# Patient Record
Sex: Female | Born: 1990 | Hispanic: Yes | Marital: Single | Smoking: Never smoker
Health system: Southern US, Community
[De-identification: ages and names within clinical notes are randomized; demographics above are authoritative.]

## PROBLEM LIST (undated history)

## (undated) ENCOUNTER — Inpatient Hospital Stay (HOSPITAL_COMMUNITY): Payer: Self-pay

## (undated) DIAGNOSIS — K2970 Gastritis, unspecified, without bleeding: Secondary | ICD-10-CM

## (undated) HISTORY — PX: NO PAST SURGERIES: SHX2092

## (undated) NOTE — *Deleted (*Deleted)
  History     CSN: [id number]  Arrival date and time: 07/06/20 4098   First Provider Initiated Contact with Patient 07/06/20 0920      Chief Complaint  Patient presents with  . Abdominal Pain   Ms. Kelly Barnes is a 70 y.o. year old G5P2012 female at [redacted]w[redacted]d weeks gestation who presents to MAU reporting ***.   OB History    Gravida  4   Para  2   Term  2   Preterm      AB  1   Living  2     SAB  1   TAB      Ectopic      Multiple      Live Births  2           Past Medical History:  Diagnosis Date  . Gastritis     Past Surgical History:  Procedure Laterality Date  . NO PAST SURGERIES      Family History  Problem Relation Age of Onset  . Lupus Mother   . Diabetes Father     Social History   Tobacco Use  . Smoking status: Never Smoker  . Smokeless tobacco: Never Used  Vaping Use  . Vaping Use: Never used  Substance Use Topics  . Alcohol use: No  . Drug use: Yes    Types: Marijuana    Comment: September 2019    Allergies: No Known Allergies  Medications Prior to Admission  Medication Sig Dispense Refill Last Dose  . metroNIDAZOLE (FLAGYL) 500 MG tablet Take 1 tablet (500 mg total) by mouth 2 (two) times daily. 14 tablet 0   . ondansetron (ZOFRAN ODT) 4 MG disintegrating tablet Take 1 tablet (4 mg total) by mouth every 8 (eight) hours as needed for nausea or vomiting. 12 tablet 0   . pantoprazole (PROTONIX) 40 MG tablet Take 1 tablet (40 mg total) by mouth daily. 30 tablet 0     Review of Systems Physical Exam   Blood pressure (!) 152/81, pulse 94, temperature 98.4 F (36.9 C), resp. rate 16, height 5\' 3"  (1.6 m), weight 101.2 kg, last menstrual period 05/30/2020, SpO2 100 %, unknown if currently breastfeeding.  Physical Exam  MAU Course  Procedures  MDM ***  Assessment and Plan  ***  Raelyn Mora, MSN, CNM 07/06/2020, 11:25 AM

---

## 2011-08-11 ENCOUNTER — Emergency Department (HOSPITAL_COMMUNITY)
Admission: EM | Admit: 2011-08-11 | Discharge: 2011-08-11 | Disposition: A | Discharge status: Home / Self Care | Payer: Self-pay | Source: Home / Self Care | Attending: Family Medicine | Admitting: Family Medicine

## 2011-08-11 DIAGNOSIS — R110 Nausea: Secondary | ICD-10-CM

## 2011-08-11 DIAGNOSIS — Z3400 Encounter for supervision of normal first pregnancy, unspecified trimester: Secondary | ICD-10-CM

## 2011-08-11 HISTORY — DX: Gastritis, unspecified, without bleeding: K29.70

## 2011-08-11 MED ORDER — PRENATAL COMPLETE 14-0.4 MG PO TABS: 1.0000 | ORAL_TABLET | ORAL | Status: DC

## 2011-08-11 NOTE — ED Notes (Signed)
Pt states she hasn't had period since Oct and would like to have pregnancy test

## 2011-08-11 NOTE — ED Provider Notes (Signed)
History     CSN: [id number] Arrival date & time: 08/11/2011  1:57 PM   First MD Initiated Contact with Patient 08/11/11 1251      Chief Complaint  Patient presents with  . Possible Pregnancy    (Consider location/radiation/quality/duration/timing/severity/associated sxs/prior treatment) HPI Comments: Just wanted to be checked  For pregnanacy, I dont trust the pharmacy test".my last period was 10/16 "around", do feel some nausea in the mornings, No Pain, No vaginal bleeeding  The history is provided by the patient.    Past Medical History  Diagnosis Date  . Gastritis     History reviewed. No pertinent past surgical history.  History reviewed. No pertinent family history.  History  Substance Use Topics  . Smoking status: Never Smoker   . Smokeless tobacco: Not on file  . Alcohol Use: No    OB History    Grav Para Term Preterm Abortions TAB SAB Ect Mult Living                  Review of Systems  Constitutional: Positive for appetite change. Negative for fever.  Gastrointestinal: Positive for nausea. Negative for abdominal pain.  Genitourinary: Negative for dysuria and vaginal bleeding.    Allergies  Review of patient's allergies indicates no known allergies.  Home Medications   Current Outpatient Rx  Name Route Sig Dispense Refill  . PRENATAL COMPLETE 14-0.4 MG PO TABS Oral Take 1 tablet by mouth 1 day or 1 dose. 60 each 1    BP 138/59  Pulse 93  Temp(Src) 98.6 F (37 C) (Oral)  Resp 18  SpO2 100%  LMP 06/13/2011  Physical Exam  Constitutional: She appears well-developed and well-nourished. No distress.  Abdominal: Soft. Normal appearance. She exhibits no distension.  Skin: Skin is warm.  Psychiatric: She has a normal mood and affect. Her behavior is normal.    ED Course  Procedures (including critical care time)   Labs Reviewed  POCT PREGNANCY, URINE  POCT PREGNANCY, URINE   No results found.   1. Pregnancy, first       MDM    Pregnancy asymptomatic 1st trimester- referred to [facility] for prenatal  care and started on prenatal vitamins        Jimmie Molly, MD 08/11/11 [phone]

## 2011-08-24 ENCOUNTER — Encounter (HOSPITAL_COMMUNITY): Payer: Self-pay | Admitting: *Deleted

## 2011-08-24 ENCOUNTER — Emergency Department (INDEPENDENT_AMBULATORY_CARE_PROVIDER_SITE_OTHER)
Admission: EM | Admit: 2011-08-24 | Discharge: 2011-08-24 | Disposition: A | Discharge status: Nursing Home (Includes ICF) | Payer: Self-pay | Source: Home / Self Care | Attending: Emergency Medicine | Admitting: Emergency Medicine

## 2011-08-24 ENCOUNTER — Encounter (HOSPITAL_COMMUNITY): Payer: Self-pay

## 2011-08-24 ENCOUNTER — Inpatient Hospital Stay (HOSPITAL_COMMUNITY): Payer: Medicaid Other

## 2011-08-24 ENCOUNTER — Inpatient Hospital Stay (HOSPITAL_COMMUNITY)
Admission: AD | Admit: 2011-08-24 | Discharge: 2011-08-24 | Disposition: A | Discharge status: Home / Self Care | Payer: Medicaid Other | Source: Ambulatory Visit | Attending: Obstetrics & Gynecology | Admitting: Obstetrics & Gynecology

## 2011-08-24 DIAGNOSIS — O200 Threatened abortion: Secondary | ICD-10-CM

## 2011-08-24 DIAGNOSIS — O209 Hemorrhage in early pregnancy, unspecified: Secondary | ICD-10-CM | POA: Insufficient documentation

## 2011-08-24 LAB — ABO/RH: ABO/RH(D): B POS

## 2011-08-24 LAB — WET PREP, GENITAL
Trich, Wet Prep: NONE SEEN
Yeast Wet Prep HPF POC: NONE SEEN

## 2011-08-24 LAB — CBC
Platelets: 388 10*3/uL (ref 150–400)
RBC: 4.14 MIL/uL (ref 3.87–5.11)
WBC: 14.1 10*3/uL — ABNORMAL HIGH (ref 4.0–10.5)

## 2011-08-24 NOTE — ED Provider Notes (Signed)
History     Chief Complaint  Patient presents with  . Vaginal Bleeding   HPI  Pt is 10weeks 4 days pregnant and was seen at Urgent Care today with 1 day of painless vaginal bleeding that started at 2 pm today after intercourse last night.  She denies cramping, constipation, diarrhea, or UTI symptoms.  She denies vaginal discharge.  She denies fever or chills.    Past Medical History  Diagnosis Date  . Gastritis     No past surgical history on file.  No family history on file.  History  Substance Use Topics  . Smoking status: Never Smoker   . Smokeless tobacco: Not on file  . Alcohol Use: No    Allergies: No Known Allergies  Prescriptions prior to admission  Medication Sig Dispense Refill  . Prenatal Vit-Fe Fumarate-FA (PRENATAL COMPLETE) 14-0.4 MG TABS Take 1 tablet by mouth 1 day or 1 dose.  60 each  1    ROS Physical Exam   Blood pressure 130/70, pulse 104, temperature 99.2 F (37.3 C), temperature source Oral, resp. rate 20, height 5\' 2"  (1.575 m), weight 184 lb 6.4 oz (83.643 kg), last menstrual period 06/11/2011, SpO2 99.00%.  Physical Exam  Vitals reviewed. Constitutional: She is oriented to person, place, and time. She appears well-developed and well-nourished.  HENT:  Head: Normocephalic.  Eyes: Pupils are equal, round, and reactive to light.  Neck: Normal range of motion. Neck supple.  Cardiovascular: Normal rate.   Respiratory: Effort normal.  GI: Soft. She exhibits no distension. There is no tenderness. There is no rebound and no guarding.       FHR not audible with doppler  Genitourinary:       Small amount of dark blood from os; uterus soft enlarged ~10 weeks size, nontender; adnexal without palpable enlargement or tenderness  Musculoskeletal: Normal range of motion.  Neurological: She is alert and oriented to person, place, and time.  Skin: Skin is warm and dry.  Psychiatric: She has a normal mood and affect.    MAU Course   Procedures CBC HCG ABO/RH Ultrasound Wet prep  GC/chlamydia-pending Results for orders placed during the hospital encounter of 08/24/11 (from the past 24 hour(s))  CBC     Status: Abnormal   Collection Time   08/24/11  6:32 PM      Component Value Range   WBC 14.1 (*) 4.0 - 10.5 (K/uL)   RBC 4.14  3.87 - 5.11 (MIL/uL)   Hemoglobin 12.7  12.0 - 15.0 (g/dL)   HCT 16.1 (*) 09.6 - 46.0 (%)   MCV 86.7  78.0 - 100.0 (fL)   MCH 30.7  26.0 - 34.0 (pg)   MCHC 35.4  30.0 - 36.0 (g/dL)   RDW 04.5  40.9 - 81.1 (%)   Platelets 388  150 - 400 (K/uL)  ABO/RH     Status: Normal   Collection Time   08/24/11  6:32 PM      Component Value Range   ABO/RH(D) B POS    HCG, QUANTITATIVE, PREGNANCY     Status: Abnormal   Collection Time   08/24/11  6:32 PM      Component Value Range   hCG, Beta Chain, Quant, S [id number] (*) <5 (mIU/mL)   Assessment and Plan  Bleeding in early pregnancy Obtain prenatal care  LINEBERRY,SUSAN 08/24/2011, 6:17 PM

## 2011-08-24 NOTE — ED Provider Notes (Signed)
Kelly Barnes is a 20 y.o. who has been pregnant with LMP of October who presents with one-day of painless vaginal bleeding.  She denies any pain or cramps fevers or chills or vaginal discharge. The bleeding started spontaneously without any pelvic trauma.  She was dropped off in urgent care by a family member.  The bleeding stopped before she arrived in urgent care,  PMH reviewed.  ROS as above otherwise neg Medications reviewed. No current facility-administered medications for this encounter.   Current Outpatient Prescriptions  Medication Sig Dispense Refill  . Prenatal Vit-Fe Fumarate-FA (PRENATAL COMPLETE) 14-0.4 MG TABS Take 1 tablet by mouth 1 day or 1 dose.  60 each  1    Exam:  BP 141/64  Pulse 94  Temp 99.1 F (37.3 C)  Resp 18  SpO2 100%  LMP 06/13/2011 Gen: Well NAD HEENT: EOMI,  MMM Lungs: CTABL Nl WOB Heart: RRR no MRG Abd: NABS, NT, ND Exts: Non edematous BL  LE, warm and well perfused.   Assessment and plan: 20 year old G1 P0 with painless vaginal bleeding in the first trimester.  I suspect this is a threatened abortion. Plan to discharge patient and instruct her to proceed to [facility] hospital for further care and evaluation. She is medically stable at this time. Plan to send over via shuttle.   Clementeen Graham 08/24/11 1727

## 2011-08-24 NOTE — Progress Notes (Signed)
Patient states she was seen at Urgent Care today for spotting since this am. Was sent to MAU for further evaluation. Patient denies any pain. Last intercourse last night.

## 2011-08-24 NOTE — ED Provider Notes (Signed)
Medical screening examination/treatment/procedure(s) were performed by a resident physician and as supervising physician I was immediately available for consultation/collaboration.  Hillery Hunter, MD 08/24/11 [phone]

## 2011-08-24 NOTE — ED Notes (Signed)
Pt  repotts  Her  Last   Period    Was  Oct  15  She  Reports  She  Noticed  Vag  Bleeding  Today - She  Has had  No  Prenatal   Care     Yet   -  She  Is  Awake  As  Well as  Alert    She  denys  Any pain      At this  Time     She  Is  Sitting  Upright on  Exam table    In no  Distress

## 2011-12-20 ENCOUNTER — Inpatient Hospital Stay (HOSPITAL_COMMUNITY)
Admission: AD | Admit: 2011-12-20 | Discharge: 2011-12-20 | Disposition: A | Discharge status: Home / Self Care | Payer: Medicaid Other | Source: Ambulatory Visit | Attending: Obstetrics & Gynecology | Admitting: Obstetrics & Gynecology

## 2011-12-20 ENCOUNTER — Encounter (HOSPITAL_COMMUNITY): Payer: Self-pay

## 2011-12-20 DIAGNOSIS — O2340 Unspecified infection of urinary tract in pregnancy, unspecified trimester: Secondary | ICD-10-CM

## 2011-12-20 DIAGNOSIS — R109 Unspecified abdominal pain: Secondary | ICD-10-CM | POA: Insufficient documentation

## 2011-12-20 DIAGNOSIS — M545 Low back pain, unspecified: Secondary | ICD-10-CM | POA: Insufficient documentation

## 2011-12-20 DIAGNOSIS — O99891 Other specified diseases and conditions complicating pregnancy: Secondary | ICD-10-CM | POA: Insufficient documentation

## 2011-12-20 DIAGNOSIS — B379 Candidiasis, unspecified: Secondary | ICD-10-CM

## 2011-12-20 LAB — URINE MICROSCOPIC-ADD ON

## 2011-12-20 LAB — URINALYSIS, ROUTINE W REFLEX MICROSCOPIC
Glucose, UA: NEGATIVE mg/dL
Hgb urine dipstick: NEGATIVE
Ketones, ur: NEGATIVE mg/dL
Protein, ur: NEGATIVE mg/dL

## 2011-12-20 LAB — WET PREP, GENITAL

## 2011-12-20 MED ORDER — TERCONAZOLE 0.8 % VA CREA: 1.0000 | TOPICAL_CREAM | Freq: Every day | VAGINAL | Status: AC

## 2011-12-20 MED ORDER — FLUCONAZOLE 150 MG PO TABS
150.0000 mg | ORAL_TABLET | Freq: Once | ORAL | Status: AC
  Administered 2011-12-20: 150 mg via ORAL
  Filled 2011-12-20: qty 1

## 2011-12-20 MED ORDER — CEPHALEXIN 500 MG PO CAPS: 500.0000 mg | ORAL_CAPSULE | Freq: Four times a day (QID) | ORAL | Status: AC

## 2011-12-20 NOTE — Discharge Instructions (Signed)
Take tylenol for pain. Use a pregnancy girdle for support for your back. keep your follow up appointment with Dr. Clearance Coots. Return here as needed.   Back Pain in Pregnancy Back pain during pregnancy is common. It happens in about half of all pregnancies. It is important for you and your baby that you remain active during your pregnancy.If you feel that back pain is not allowing you to remain active or sleep well, it is time to see your caregiver. Back pain may be caused by several factors related to changes during your pregnancy.Fortunately, unless you had trouble with your back before your pregnancy, the pain is likely to get better after you deliver. Low back pain usually occurs between the fifth and seventh months of pregnancy. It can, however, happen in the first couple months. Factors that increase the risk of back problems include:   Previous back problems.   Injury to your back.   Having twins or multiple births.   A chronic cough.   Stress.   Job-related repetitive motions.   Muscle or spinal disease in the back.   Family history of back problems, ruptured (herniated) discs, or osteoporosis.   Depression, anxiety, and panic attacks.  CAUSES   When you are pregnant, your body produces a hormone called relaxin. This hormonemakes the ligaments connecting the low back and pubic bones more flexible. This flexibility allows the baby to be delivered more easily. When your ligaments are loose, your muscles need to work harder to support your back. Soreness in your back can come from tired muscles. Soreness can also come from back tissues that are irritated since they are receiving less support.   As the baby grows, it puts pressure on the nerves and blood vessels in your pelvis. This can cause back pain.   As the baby grows and gets heavier during pregnancy, the uterus pushes the stomach muscles forward and changes your center of gravity. This makes your back muscles work harder to  maintain good posture.  SYMPTOMS  Lumbar pain during pregnancy Lumbar pain during pregnancy usually occurs at or above the waist in the center of the back. There may be pain and numbness that radiates into your leg or foot. This is similar to low back pain experienced by non-pregnant women. It usually increases with sitting for long periods of time, standing, or repetitive lifting. Tenderness may also be present in the muscles along your upper back. Posterior pelvic pain during pregnancy Pain in the back of the pelvis is more common than lumbar pain in pregnancy. It is a deep pain felt in your side at the waistline, or across the tailbone (sacrum), or in both places. You may have pain on one or both sides. This pain can also go into the buttocks and backs of the upper thighs. Pubic and groin pain may also be present. The pain does not quickly resolve with rest, and morning stiffness may also be present. Pelvic pain during pregnancy can be brought on by most activities. A high level of fitness before and during pregnancy may or may not prevent this problem. Labor pain is usually 1 to 2 minutes apart, lasts for about 1 minute, and involves a bearing down feeling or pressure in your pelvis. However, if you are at term with the pregnancy, constant low back pain can be the beginning of early labor, and you should be aware of this. DIAGNOSIS  X-rays of the back should not be done during the first 12 to 14 weeks  of the pregnancy and only when absolutely necessary during the rest of the pregnancy. MRIs do not give off radiation and are safe during pregnancy. MRIs also should only be done when absolutely necessary. HOME CARE INSTRUCTIONS  Exercise as directed by your caregiver. Exercise is the most effective way to prevent or manage back pain. If you have a back problem, it is especially important to avoid sports that require sudden body movements. Swimming and walking are great activities.   Do not stand in one  place for long periods of time.   Do not wear high heels.   Sit in chairs with good posture. Use a pillow on your lower back if necessary. Make sure your head rests over your shoulders and is not hanging forward.   Try sleeping on your side, preferably the left side, with a pillow or two between your legs. If you are sore after a night's rest, your bedmay betoo soft.Try placing a board between your mattress and box spring.   Listen to your body when lifting.If you are experiencing pain, ask for help or try bending yourknees more so you can use your leg muscles rather than your back muscles. Squat down when picking up something from the floor. Do not bend over.   Eat a healthy diet. Try to gain weight within your caregiver's recommendations.   Use heat or cold packs 3 to 4 times a day for 15 minutes to help with the pain.   Only take over-the-counter or prescription medicines for pain, discomfort, or fever as directed by your caregiver.  Sudden (acute) back pain  Use bed rest for only the most extreme, acute episodes of back pain. Prolonged bed rest over 48 hours will aggravate your condition.   Ice is very effective for acute conditions.   Put ice in a plastic bag.   Place a towel between your skin and the bag.   Leave the ice on for 10 to 20 minutes every 2 hours, or as needed.   Using heat packs for 30 minutes prior to activities is also helpful.  Continued back pain See your caregiver if you have continued problems. Your caregiver can help or refer you for appropriate physical therapy. With conditioning, most back problems can be avoided. Sometimes, a more serious issue may be the cause of back pain. You should be seen right away if new problems seem to be developing. Your caregiver may recommend:  A maternity girdle.   An elastic sling.   A back brace.   A massage therapist or acupuncture.  SEEK MEDICAL CARE IF:   You are not able to do most of your daily activities,  even when taking the pain medicine you were given.   You need a referral to a physical therapist or chiropractor.   You want to try acupuncture.  SEEK IMMEDIATE MEDICAL CARE IF:  You develop numbness, tingling, weakness, or problems with the use of your arms or legs.   You develop severe back pain that is no longer relieved with medicines.   You have a sudden change in bowel or bladder control.   You have increasing pain in other areas of the body.   You develop shortness of breath, dizziness, or fainting.   You develop nausea, vomiting, or sweating.   You have back pain which is similar to labor pains.   You have back pain along with your water breaking or vaginal bleeding.   You have back pain or numbness that travels down  your leg.   Your back pain developed after you fell.   You develop pain on one side of your back. You may have a kidney stone.   You see blood in your urine. You may have a bladder infection or kidney stone.   You have back pain with blisters. You may have shingles.  Back pain is fairly common during pregnancy but should not be accepted as just part of the process. Back pain should always be treated as soon as possible. This will make your pregnancy as pleasant as possible. Document Released: 11/21/2005 Document Revised: 08/02/2011 Document Reviewed: 01/02/2011 [facility] Patient Information 2012 [place], [place].Back Pain in Pregnancy Back pain during pregnancy is common. It happens in about half of all pregnancies. It is important for you and your baby that you remain active during your pregnancy.If you feel that back pain is not allowing you to remain active or sleep well, it is time to see your caregiver. Back pain may be caused by several factors related to changes during your pregnancy.Fortunately, unless you had trouble with your back before your pregnancy, the pain is likely to get better after you deliver. Low back pain usually occurs between the fifth  and seventh months of pregnancy. It can, however, happen in the first couple months. Factors that increase the risk of back problems include:   Previous back problems.   Injury to your back.   Having twins or multiple births.   A chronic cough.   Stress.   Job-related repetitive motions.   Muscle or spinal disease in the back.   Family history of back problems, ruptured (herniated) discs, or osteoporosis.   Depression, anxiety, and panic attacks.  CAUSES   When you are pregnant, your body produces a hormone called relaxin. This hormonemakes the ligaments connecting the low back and pubic bones more flexible. This flexibility allows the baby to be delivered more easily. When your ligaments are loose, your muscles need to work harder to support your back. Soreness in your back can come from tired muscles. Soreness can also come from back tissues that are irritated since they are receiving less support.   As the baby grows, it puts pressure on the nerves and blood vessels in your pelvis. This can cause back pain.   As the baby grows and gets heavier during pregnancy, the uterus pushes the stomach muscles forward and changes your center of gravity. This makes your back muscles work harder to maintain good posture.  SYMPTOMS  Lumbar pain during pregnancy Lumbar pain during pregnancy usually occurs at or above the waist in the center of the back. There may be pain and numbness that radiates into your leg or foot. This is similar to low back pain experienced by non-pregnant women. It usually increases with sitting for long periods of time, standing, or repetitive lifting. Tenderness may also be present in the muscles along your upper back. Posterior pelvic pain during pregnancy Pain in the back of the pelvis is more common than lumbar pain in pregnancy. It is a deep pain felt in your side at the waistline, or across the tailbone (sacrum), or in both places. You may have pain on one or both  sides. This pain can also go into the buttocks and backs of the upper thighs. Pubic and groin pain may also be present. The pain does not quickly resolve with rest, and morning stiffness may also be present. Pelvic pain during pregnancy can be brought on by most activities. A high  level of fitness before and during pregnancy may or may not prevent this problem. Labor pain is usually 1 to 2 minutes apart, lasts for about 1 minute, and involves a bearing down feeling or pressure in your pelvis. However, if you are at term with the pregnancy, constant low back pain can be the beginning of early labor, and you should be aware of this. DIAGNOSIS  X-rays of the back should not be done during the first 12 to 14 weeks of the pregnancy and only when absolutely necessary during the rest of the pregnancy. MRIs do not give off radiation and are safe during pregnancy. MRIs also should only be done when absolutely necessary. HOME CARE INSTRUCTIONS  Exercise as directed by your caregiver. Exercise is the most effective way to prevent or manage back pain. If you have a back problem, it is especially important to avoid sports that require sudden body movements. Swimming and walking are great activities.   Do not stand in one place for long periods of time.   Do not wear high heels.   Sit in chairs with good posture. Use a pillow on your lower back if necessary. Make sure your head rests over your shoulders and is not hanging forward.   Try sleeping on your side, preferably the left side, with a pillow or two between your legs. If you are sore after a night's rest, your bedmay betoo soft.Try placing a board between your mattress and box spring.   Listen to your body when lifting.If you are experiencing pain, ask for help or try bending yourknees more so you can use your leg muscles rather than your back muscles. Squat down when picking up something from the floor. Do not bend over.   Eat a healthy diet. Try to  gain weight within your caregiver's recommendations.   Use heat or cold packs 3 to 4 times a day for 15 minutes to help with the pain.   Only take over-the-counter or prescription medicines for pain, discomfort, or fever as directed by your caregiver.  Sudden (acute) back pain  Use bed rest for only the most extreme, acute episodes of back pain. Prolonged bed rest over 48 hours will aggravate your condition.   Ice is very effective for acute conditions.   Put ice in a plastic bag.   Place a towel between your skin and the bag.   Leave the ice on for 10 to 20 minutes every 2 hours, or as needed.   Using heat packs for 30 minutes prior to activities is also helpful.  Continued back pain See your caregiver if you have continued problems. Your caregiver can help or refer you for appropriate physical therapy. With conditioning, most back problems can be avoided. Sometimes, a more serious issue may be the cause of back pain. You should be seen right away if new problems seem to be developing. Your caregiver may recommend:  A maternity girdle.   An elastic sling.   A back brace.   A massage therapist or acupuncture.  SEEK MEDICAL CARE IF:   You are not able to do most of your daily activities, even when taking the pain medicine you were given.   You need a referral to a physical therapist or chiropractor.   You want to try acupuncture.  SEEK IMMEDIATE MEDICAL CARE IF:  You develop numbness, tingling, weakness, or problems with the use of your arms or legs.   You develop severe back pain that is no longer  relieved with medicines.   You have a sudden change in bowel or bladder control.   You have increasing pain in other areas of the body.   You develop shortness of breath, dizziness, or fainting.   You develop nausea, vomiting, or sweating.   You have back pain which is similar to labor pains.   You have back pain along with your water breaking or vaginal bleeding.   You  have back pain or numbness that travels down your leg.   Your back pain developed after you fell.   You develop pain on one side of your back. You may have a kidney stone.   You see blood in your urine. You may have a bladder infection or kidney stone.   You have back pain with blisters. You may have shingles.  Back pain is fairly common during pregnancy but should not be accepted as just part of the process. Back pain should always be treated as soon as possible. This will make your pregnancy as pleasant as possible. Document Released: 11/21/2005 Document Revised: 08/02/2011 Document Reviewed: 01/02/2011 [facility] Patient Information 2012 [place], [place].

## 2011-12-20 NOTE — MAU Note (Signed)
Pt states low back pain, lower midabdominal pain/cramping, all since Sunday, pain is intermittent, denies bleeding or abnormal vaginal discharge. Denies uti s/s. No problems thus far w/pregnancy. Last intercourse last weekend.

## 2011-12-20 NOTE — MAU Provider Note (Signed)
History     CSN: [id number]  Arrival date & time 12/20/11  1454   None     Chief Complaint  Patient presents with  . Back Pain    HPI Kelly Barnes is a 21 y.o. female @ [redacted]w[redacted]d gestation who presents to MAU for low back pain and vaginal discharge. The symptoms started last week. The pain in the lower back is sharp. The pain increases if in the same position for a long time. Better when up walking. Vaginal discharge is white without itching or burning but more than usual. Feeling the baby move, no bleeding, no other problems. Last sexual intercourse one week ago.  Prenatal care with Dr. Clearance Coots, last visit was last week. Had labs, pelvic, including cultures. Office called and told her everything was normal.  Follow up scheduled in 4 days.  The history was provided by the patient.   Past Medical History  Diagnosis Date  . Gastritis     History reviewed. No pertinent past surgical history.  History reviewed. No pertinent family history.  History  Substance Use Topics  . Smoking status: Never Smoker   . Smokeless tobacco: Never Used  . Alcohol Use: No    OB History    Grav Para Term Preterm Abortions TAB SAB Ect Mult Living   1         0      Review of Systems  Constitutional: Negative for fever, chills, diaphoresis and fatigue.  HENT: Negative for ear pain, congestion, sore throat, facial swelling, neck pain, neck stiffness, dental problem and sinus pressure.   Eyes: Negative for photophobia, pain and discharge.  Respiratory: Negative for cough, chest tightness and wheezing.   Cardiovascular: Negative for chest pain, palpitations and leg swelling.  Gastrointestinal: Negative for nausea, vomiting, abdominal pain, diarrhea, constipation and abdominal distention.  Genitourinary: Positive for vaginal discharge. Negative for dysuria, frequency, flank pain, vaginal bleeding, difficulty urinating and pelvic pain.  Musculoskeletal: Positive for back pain. Negative for myalgias and  gait problem.  Skin: Negative for color change and rash.  Neurological: Positive for headaches. Negative for dizziness, speech difficulty, weakness, light-headedness and numbness.  Psychiatric/Behavioral: Negative for confusion and agitation. The patient is not nervous/anxious.     Allergies  Review of patient's allergies indicates no known allergies.  Home Medications  No current outpatient prescriptions on file.  BP 116/72  Pulse 95  Temp(Src) 97.7 F (36.5 C) (Oral)  Resp 16  Ht 5\' 3"  (1.6 m)  Wt 210 lb 6 oz (95.425 kg)  BMI 37.27 kg/m2  SpO2 100%  LMP 06/11/2011  Physical Exam  Nursing note and vitals reviewed. Constitutional: She is oriented to person, place, and time. She appears well-developed and well-nourished.  HENT:  Head: Normocephalic and atraumatic.  Eyes: EOM are normal.  Neck: Neck supple.  Cardiovascular: Normal rate.   Pulmonary/Chest: Effort normal.  Abdominal: Soft. There is tenderness in the suprapubic area.  Genitourinary:       Thick, white, cheesy discharge vaginal vault. Cervix closed, thick, high. Uterus consistent with dates.  Musculoskeletal: Normal range of motion.       Tenderness lower lumbar area with palpation and range of motion.  Neurological: She is alert and oriented to person, place, and time. No cranial nerve deficit.  Skin: Skin is warm and dry.  Psychiatric: She has a normal mood and affect. Her behavior is normal. Judgment and thought content normal.   Results for orders placed during the hospital encounter of 12/20/11 (from  the past 24 hour(s))  URINALYSIS, ROUTINE W REFLEX MICROSCOPIC     Status: Abnormal   Collection Time   12/20/11  3:57 PM      Component Value Range   Color, Urine YELLOW  YELLOW    APPearance CLOUDY (*) CLEAR    Specific Gravity, Urine 1.015  1.005 - 1.030    pH 7.5  5.0 - 8.0    Glucose, UA NEGATIVE  NEGATIVE (mg/dL)   Hgb urine dipstick NEGATIVE  NEGATIVE    Bilirubin Urine NEGATIVE  NEGATIVE     Ketones, ur NEGATIVE  NEGATIVE (mg/dL)   Protein, ur NEGATIVE  NEGATIVE (mg/dL)   Urobilinogen, UA 0.2  0.0 - 1.0 (mg/dL)   Nitrite NEGATIVE  NEGATIVE    Leukocytes, UA SMALL (*) NEGATIVE   URINE MICROSCOPIC-ADD ON     Status: Abnormal   Collection Time   12/20/11  3:57 PM      Component Value Range   Squamous Epithelial / LPF MANY (*) RARE    WBC, UA 7-10  <3 (WBC/hpf)   RBC / HPF 0-2  <3 (RBC/hpf)   Bacteria, UA MANY (*) RARE    Urine-Other MANY YEAST     ED Course  Procedures EFM = FHR of 145, no contractions, reassuring tracing @ [redacted] weeks gestation  Assessment: UTI in pregnancy   Monilia    Low back pain in pregnancy   Urine culture pending.  Plan:  Diflucan 150 mg po now   Keep follow up appointment with Dr. Clearance Coots   Rx Terazol 7 cream   Pregnancy girdle   MDM

## 2011-12-22 LAB — URINE CULTURE: Culture  Setup Time: 201304260145

## 2012-03-18 IMAGING — US US OB COMP LESS 14 WK
1 series · 14 of 24 positions shown · non-contrast
Comparison: None.

CLINICAL DATA: 20-year-old G1, LMP 06/11/2011 ([DATE] weeks 4 days),
pending quantitative beta HCG, presenting with vaginal bleeding.

OBSTETRIC <14 WK ULTRASOUND 08/24/2011:
TECHNIQUE: Transabdominal ultrasound was performed for evaluation
of the gestation as well as the maternal uterus and adnexal
regions.

[Series 1: us ob comp less 14 wks · 14 of 24 slices shown]
[im 1/24]
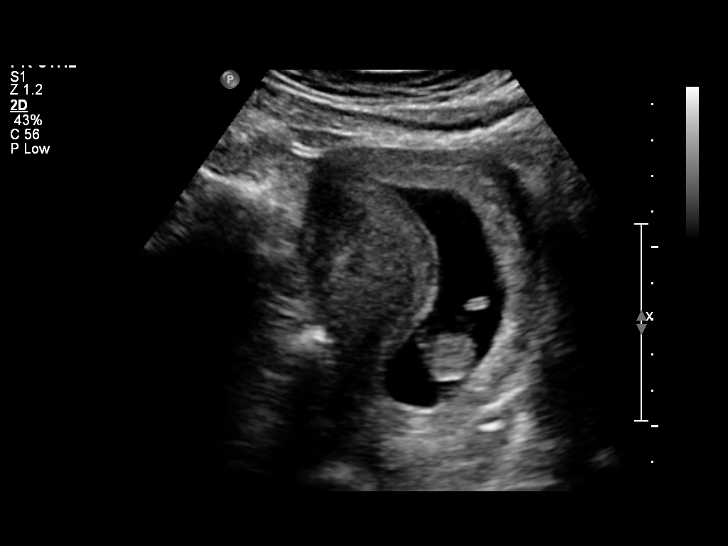
[im 3/24]
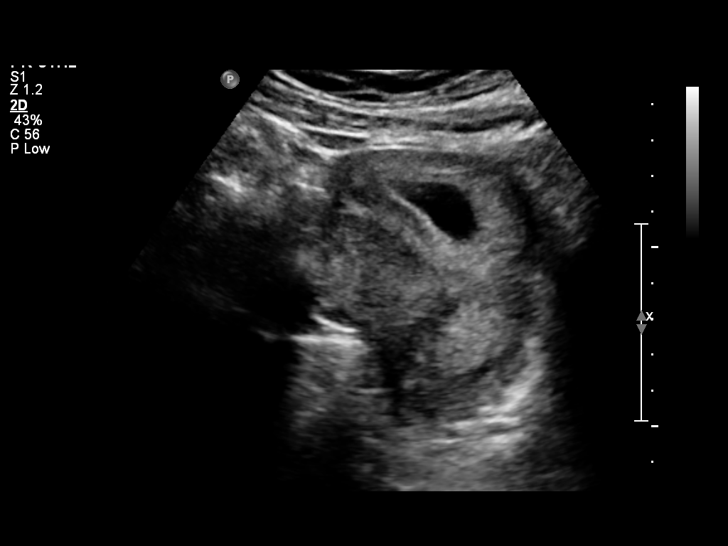
[im 5/24]
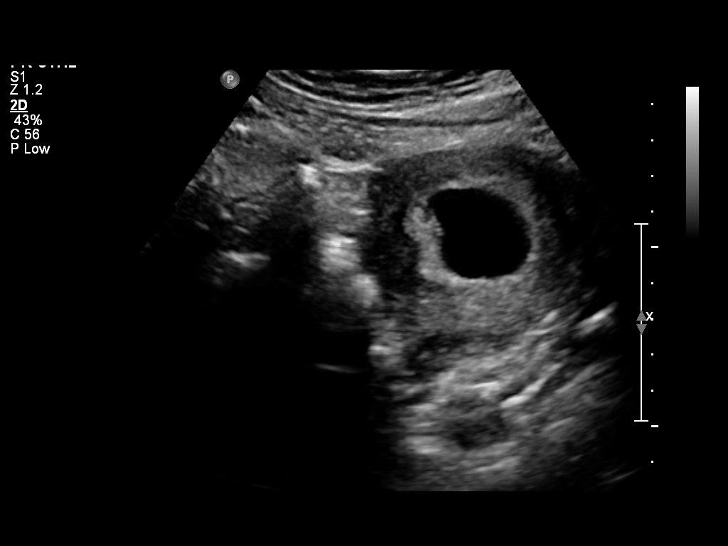
[im 7/24]
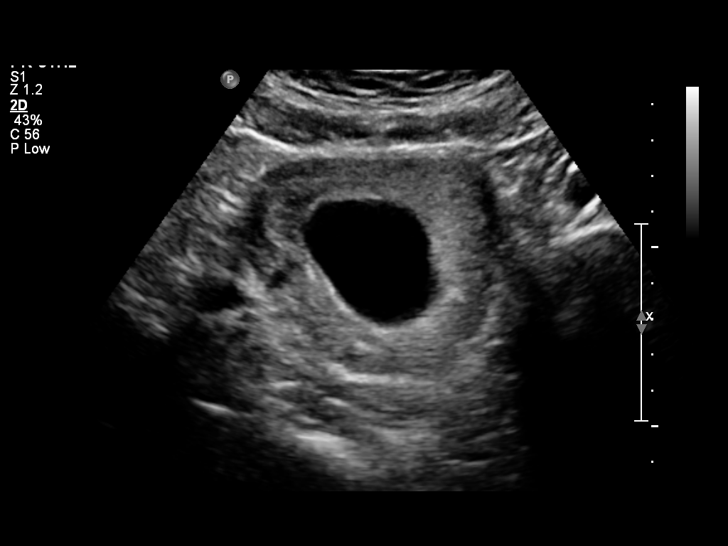
[im 8/24]
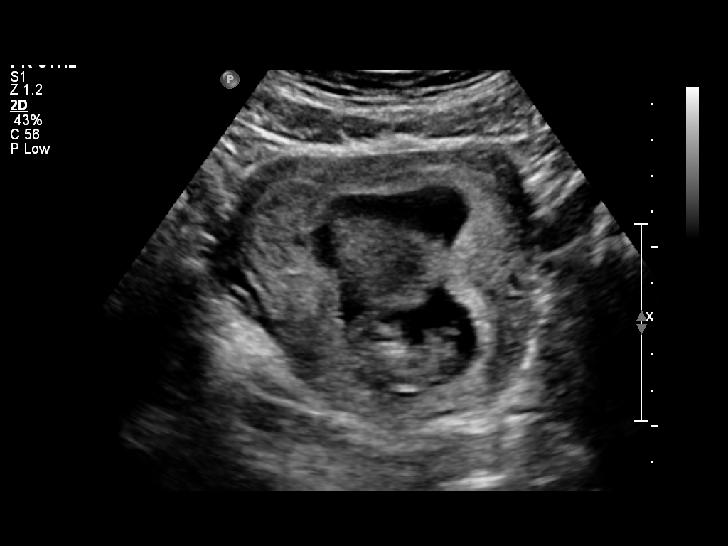
[im 10/24]
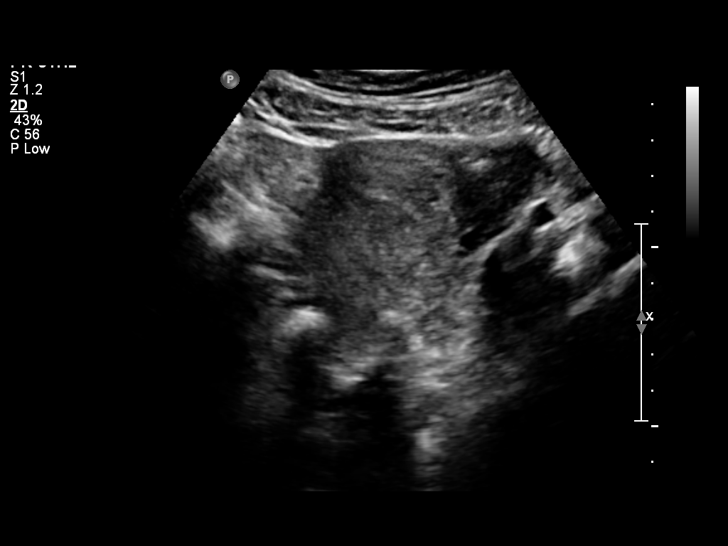
[im 12/24]
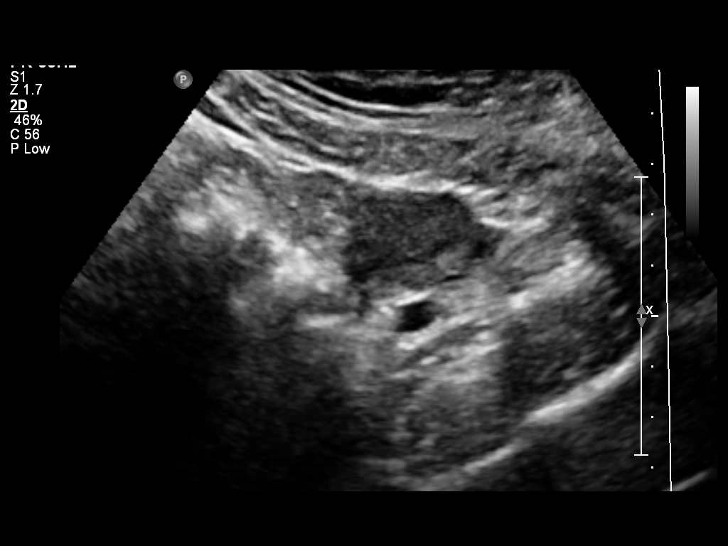
[im 13/24]
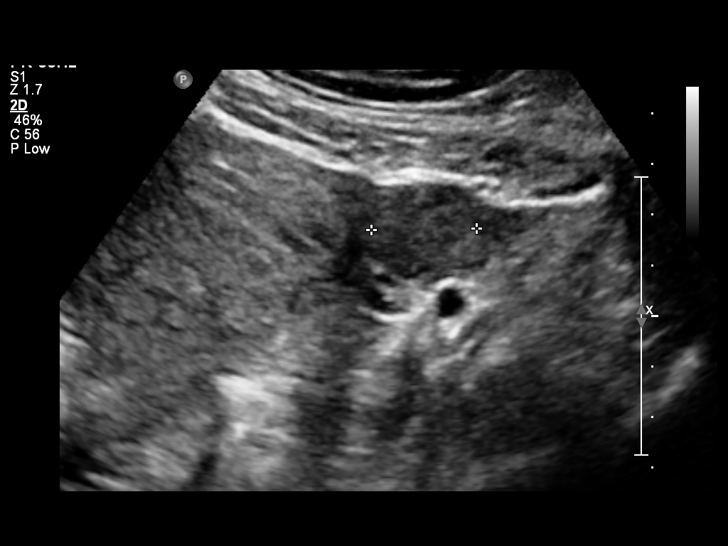
[im 15/24]
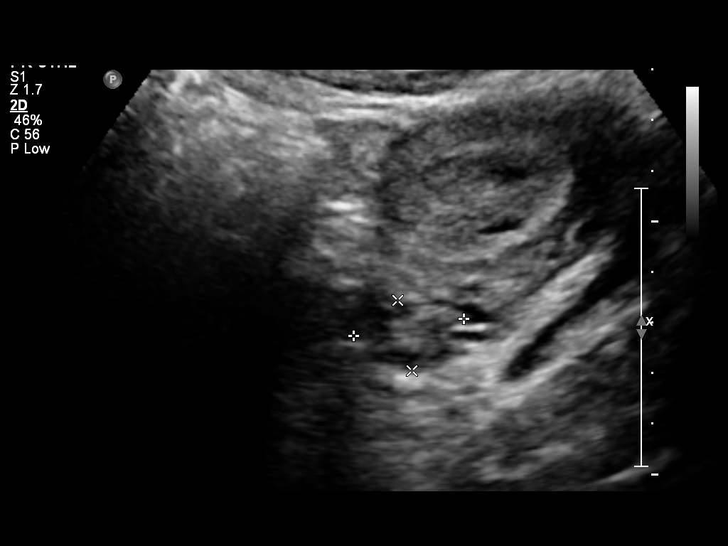
[im 17/24]
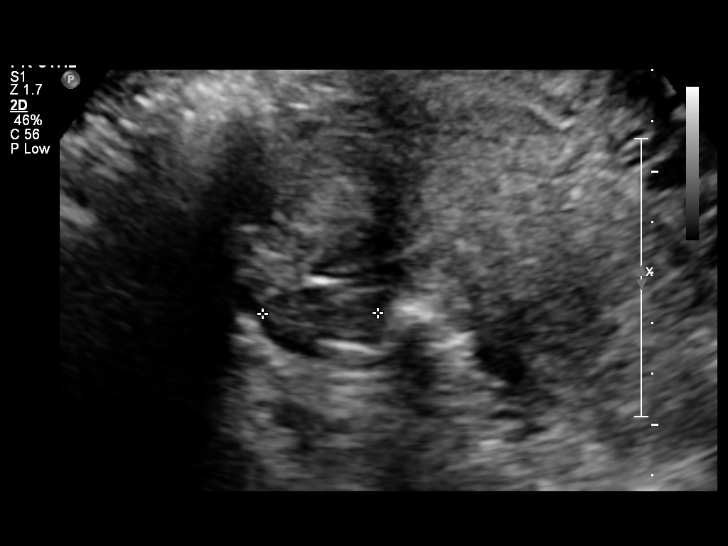
[im 19/24]
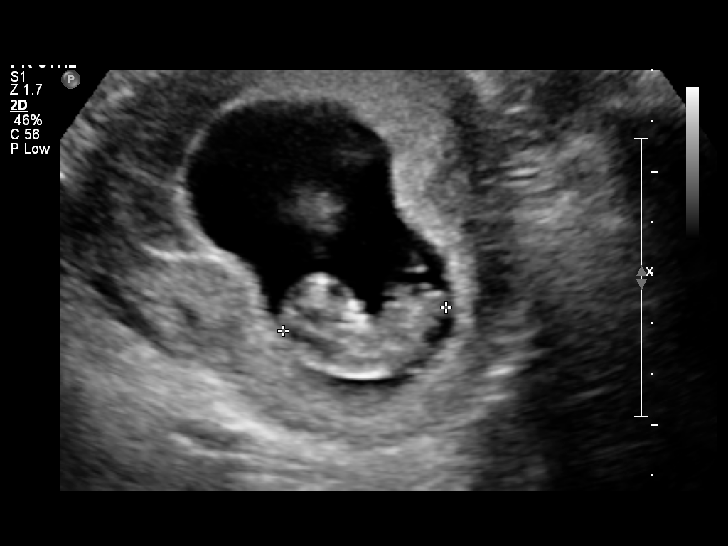
[im 20/24]
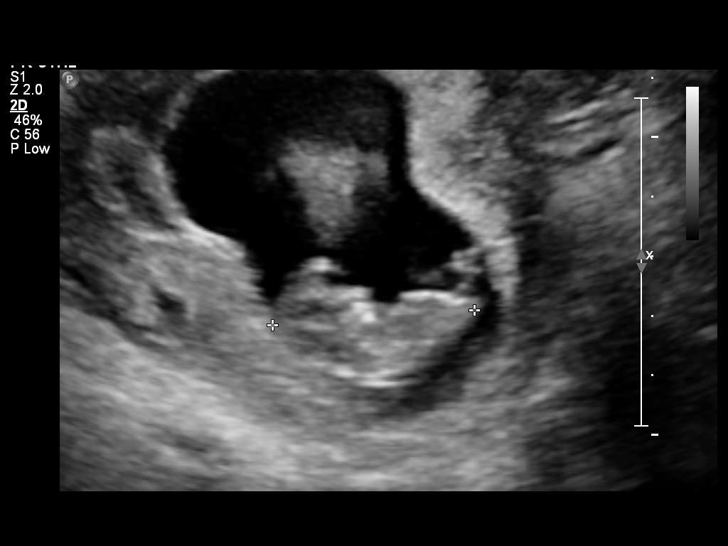
[im 22/24]
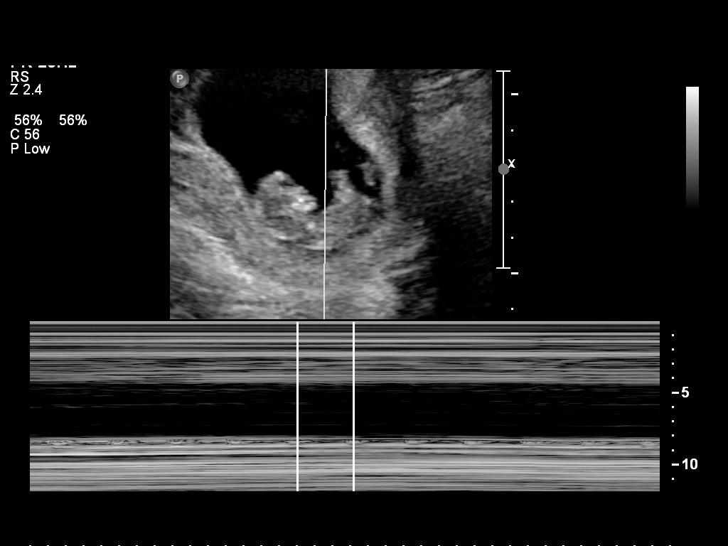
[im 24/24]
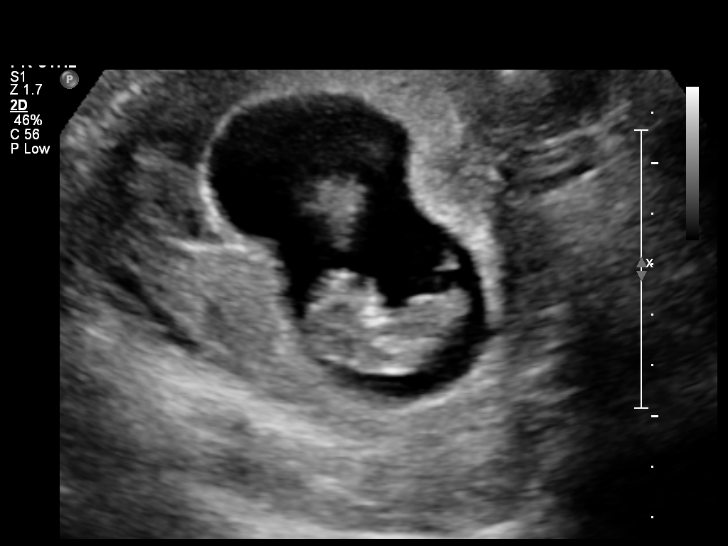

[14 of 24 positions shown; findings below may reference images not displayed]

Intrauterine gestational sac: Single, normal in shape.
Yolk sac: Visualized.
Embryo: Visualized.
Cardiac Activity: Visualized.
Heart Rate: 160 bpm

CRL:  33.3 mm  10w  2d        US EDC: 03/19/2012

Maternal uterus/Adnexae:
No subchorionic hemorrhage.  Normal-appearing ovaries, the right
measuring approximately 2.2 x 1.4 x 2.3 cm and the left measuring
approximately 2.6 x 1.8 x 2.1 cm.  No adnexal masses or free pelvic
fluid.
IMPRESSION: 1.  Single living intrauterine embryo with estimated gestational
age 10 weeks 2 days by crown-rump length, yielding an EDC of
03/20/2011.  This corresponds very well with the gestational age by
LMP of 10 weeks 4 days.
2.  No subchorionic hemorrhage.
3.  Normal-appearing ovaries.  No adnexal masses or free fluid.

## 2014-02-07 ENCOUNTER — Emergency Department (HOSPITAL_BASED_OUTPATIENT_CLINIC_OR_DEPARTMENT_OTHER)
Admission: EM | Admit: 2014-02-07 | Discharge: 2014-02-07 | Disposition: A | Discharge status: Home / Self Care | Payer: Medicaid Other | Attending: Emergency Medicine | Admitting: Emergency Medicine

## 2014-02-07 ENCOUNTER — Encounter (HOSPITAL_BASED_OUTPATIENT_CLINIC_OR_DEPARTMENT_OTHER): Payer: Self-pay | Admitting: Emergency Medicine

## 2014-02-07 DIAGNOSIS — O9989 Other specified diseases and conditions complicating pregnancy, childbirth and the puerperium: Secondary | ICD-10-CM | POA: Insufficient documentation

## 2014-02-07 DIAGNOSIS — Z3490 Encounter for supervision of normal pregnancy, unspecified, unspecified trimester: Secondary | ICD-10-CM

## 2014-02-07 DIAGNOSIS — Z792 Long term (current) use of antibiotics: Secondary | ICD-10-CM | POA: Insufficient documentation

## 2014-02-07 DIAGNOSIS — R000 Tachycardia, unspecified: Secondary | ICD-10-CM | POA: Insufficient documentation

## 2014-02-07 DIAGNOSIS — Z79899 Other long term (current) drug therapy: Secondary | ICD-10-CM | POA: Insufficient documentation

## 2014-02-07 DIAGNOSIS — J039 Acute tonsillitis, unspecified: Secondary | ICD-10-CM

## 2014-02-07 LAB — URINALYSIS, ROUTINE W REFLEX MICROSCOPIC
BILIRUBIN URINE: NEGATIVE
GLUCOSE, UA: NEGATIVE mg/dL
HGB URINE DIPSTICK: NEGATIVE
Ketones, ur: NEGATIVE mg/dL
Nitrite: NEGATIVE
Protein, ur: 30 mg/dL — AB
SPECIFIC GRAVITY, URINE: 1.021 (ref 1.005–1.030)
Urobilinogen, UA: 0.2 mg/dL (ref 0.0–1.0)
pH: 6 (ref 5.0–8.0)

## 2014-02-07 LAB — URINE MICROSCOPIC-ADD ON

## 2014-02-07 LAB — MONONUCLEOSIS SCREEN: Mono Screen: NEGATIVE

## 2014-02-07 LAB — PREGNANCY, URINE: PREG TEST UR: POSITIVE — AB

## 2014-02-07 LAB — RAPID STREP SCREEN (MED CTR MEBANE ONLY): STREPTOCOCCUS, GROUP A SCREEN (DIRECT): NEGATIVE

## 2014-02-07 MED ORDER — AMOXICILLIN 500 MG PO CAPS: 500.0000 mg | ORAL_CAPSULE | Freq: Three times a day (TID) | ORAL | Status: DC

## 2014-02-07 MED ORDER — ACETAMINOPHEN 325 MG PO TABS
650.0000 mg | ORAL_TABLET | Freq: Once | ORAL | Status: AC
  Administered 2014-02-07: 650 mg via ORAL

## 2014-02-07 MED ORDER — IBUPROFEN 600 MG PO TABS: 600.0000 mg | ORAL_TABLET | Freq: Four times a day (QID) | ORAL | Status: DC | PRN

## 2014-02-07 MED ORDER — ACETAMINOPHEN 325 MG PO TABS
ORAL_TABLET | ORAL | Status: AC
  Filled 2014-02-07: qty 2

## 2014-02-07 NOTE — ED Notes (Signed)
Pt reports fever and sore throat since last.  Sts it's difficult to swallow.

## 2014-02-07 NOTE — ED Notes (Signed)
Sore throat, fever

## 2014-02-07 NOTE — ED Provider Notes (Signed)
CSN: [id number]     Arrival date & time 02/07/14  1437 History   First MD Initiated Contact with Patient 02/07/14 1520     Chief Complaint  Patient presents with  . Sore Throat     (Consider location/radiation/quality/duration/timing/severity/associated sxs/prior Treatment) Patient is a 23 y.o. female presenting with pharyngitis. The history is provided by the patient.  Sore Throat This is a new problem. The current episode started in the past 7 days. The problem has been gradually worsening. Associated symptoms include chills, a fever and a sore throat. Pertinent negatives include no abdominal pain, chest pain, congestion, coughing, nausea, neck pain or vomiting. Headaches: when fever goes up. The symptoms are aggravated by swallowing and eating. She has tried acetaminophen and NSAIDs for the symptoms. The treatment provided moderate relief.   Regan RakersJulissa Jayme CloudGonzalez is a 23 y.o. female who presents to the ED with a sore throat that started 4 days ago. Last night it seemed to be worse and she had a fever of 102. Today it hurts to swallow and she continues to have fever. LMP 12/31/2013.   Past Medical History  Diagnosis Date  . Gastritis    History reviewed. No pertinent past surgical history. No family history on file. History  Substance Use Topics  . Smoking status: Never Smoker   . Smokeless tobacco: Never Used  . Alcohol Use: No   OB History   Grav Para Term Preterm Abortions TAB SAB Ect Mult Living   1         0     Review of Systems  Constitutional: Positive for fever and chills.  HENT: Positive for sore throat. Negative for congestion and ear pain.   Respiratory: Negative for cough, shortness of breath and wheezing.   Cardiovascular: Negative for chest pain.  Gastrointestinal: Negative for nausea, vomiting and abdominal pain.  Genitourinary: Negative for dysuria, frequency, vaginal bleeding and vaginal discharge.  Musculoskeletal: Negative for back pain and neck pain.   Neurological: Negative for light-headedness. Headaches: when fever goes up.  Psychiatric/Behavioral: Negative for confusion. The patient is not nervous/anxious.       Allergies  Review of patient's allergies indicates no known allergies.  Home Medications   Prior to Admission medications   Medication Sig Start Date End Date Taking? Authorizing Provider  amoxicillin (AMOXIL) 500 MG capsule Take 1 capsule (500 mg total) by mouth 3 (three) times daily. 02/07/14   Hope Orlene OchM Neese, NP  fish oil-omega-3 fatty acids 1000 MG capsule Take 2 g by mouth daily.    Historical Provider, MD  ibuprofen (ADVIL,MOTRIN) 600 MG tablet Take 1 tablet (600 mg total) by mouth every 6 (six) hours as needed. 02/07/14   Hope Orlene OchM Neese, NP  Prenatal Vit-Fe Fumarate-FA (PRENATAL MULTIVITAMIN) TABS Take 1 tablet by mouth daily.    Historical Provider, MD   BP 144/82  Pulse 104  Temp(Src) 100.3 F (37.9 C) (Axillary)  Resp 22  Ht 5\' 3"  (1.6 m)  Wt 210 lb (95.255 kg)  BMI 37.21 kg/m2  SpO2 100%  LMP 12/31/2013  Breastfeeding? Unknown Physical Exam  Nursing note and vitals reviewed. Constitutional: She is oriented to person, place, and time. She appears well-developed and well-nourished.  HENT:  Head: Normocephalic and atraumatic.  Right Ear: Tympanic membrane normal.  Left Ear: Tympanic membrane normal.  Nose: Nose normal.  Mouth/Throat: Uvula is midline and mucous membranes are normal. Oropharyngeal exudate and posterior oropharyngeal erythema present.  Eyes: Conjunctivae and EOM are normal. Pupils are equal, round,  and reactive to light.  Neck: Normal range of motion. Neck supple.  Cardiovascular: Tachycardia present.   Pulmonary/Chest: Effort normal. She has no wheezes. She has no rales.  Abdominal: Soft. Bowel sounds are normal. There is no tenderness.  Musculoskeletal: Normal range of motion.  Lymphadenopathy:    She has cervical adenopathy.  Neurological: She is alert and oriented to person, place,  and time. No cranial nerve deficit.  Skin: Skin is warm and dry.  Psychiatric: She has a normal mood and affect. Her behavior is normal.    ED Course  Procedures (including critical care time) Labs Review  Results for orders placed during the hospital encounter of 02/07/14 (from the past 24 hour(s))  RAPID STREP SCREEN     Status: None   Collection Time    02/07/14  2:45 PM      Result Value Ref Range   Streptococcus, Group A Screen (Direct) NEGATIVE  NEGATIVE  URINALYSIS, ROUTINE W REFLEX MICROSCOPIC     Status: Abnormal   Collection Time    02/07/14  2:50 PM      Result Value Ref Range   Color, Urine YELLOW  YELLOW   APPearance CLOUDY (*) CLEAR   Specific Gravity, Urine 1.021  1.005 - 1.030   pH 6.0  5.0 - 8.0   Glucose, UA NEGATIVE  NEGATIVE mg/dL   Hgb urine dipstick NEGATIVE  NEGATIVE   Bilirubin Urine NEGATIVE  NEGATIVE   Ketones, ur NEGATIVE  NEGATIVE mg/dL   Protein, ur 30 (*) NEGATIVE mg/dL   Urobilinogen, UA 0.2  0.0 - 1.0 mg/dL   Nitrite NEGATIVE  NEGATIVE   Leukocytes, UA SMALL (*) NEGATIVE  PREGNANCY, URINE     Status: Abnormal   Collection Time    02/07/14  2:50 PM      Result Value Ref Range   Preg Test, Ur POSITIVE (*) NEGATIVE  URINE MICROSCOPIC-ADD ON     Status: Abnormal   Collection Time    02/07/14  2:50 PM      Result Value Ref Range   Squamous Epithelial / LPF MANY (*) RARE   WBC, UA 3-6  <3 WBC/hpf   Bacteria, UA MANY (*) RARE  MONONUCLEOSIS SCREEN     Status: None   Collection Time    02/07/14  4:05 PM      Result Value Ref Range   Mono Screen NEGATIVE  NEGATIVE    MDM  23 y.o. female with exudative tonsillitis, fever and chills. Will treat with antibiotics. She also has a positive pregnancy test. I have reviewed this patient's vital signs, nurses notes, appropriate labs and discussed findings with the patient and plan of care. She is stable for discharge and will follow up with her OB to start her prenatal care. Strep screen sent for culture  since clinically patient looks and smells like strep will treat with antibiotics. She has no tonsillar abscess and no difficulty swallowing.    [facility] Orlene OchM Neese, [place] 02/08/14 0040

## 2014-02-07 NOTE — ED Notes (Signed)
NP at bedside.

## 2014-02-09 LAB — CULTURE, GROUP A STREP

## 2014-02-15 NOTE — ED Provider Notes (Signed)
Medical screening examination/treatment/procedure(s) were performed by non-physician practitioner and as supervising physician I was immediately available for consultation/collaboration.   EKG Interpretation None        Junius ArgyleForrest S Dariana Garbett, MD 02/15/14 Ernestina [id number]

## 2014-06-28 ENCOUNTER — Encounter (HOSPITAL_BASED_OUTPATIENT_CLINIC_OR_DEPARTMENT_OTHER): Payer: Self-pay | Admitting: Emergency Medicine

## 2015-07-31 ENCOUNTER — Emergency Department (HOSPITAL_COMMUNITY): Payer: Medicaid Other

## 2015-07-31 ENCOUNTER — Encounter (HOSPITAL_COMMUNITY): Payer: Self-pay | Admitting: Emergency Medicine

## 2015-07-31 ENCOUNTER — Emergency Department (HOSPITAL_COMMUNITY)
Admission: EM | Admit: 2015-07-31 | Discharge: 2015-07-31 | Disposition: A | Discharge status: Home / Self Care | Payer: Medicaid Other | Attending: Physician Assistant | Admitting: Physician Assistant

## 2015-07-31 DIAGNOSIS — A5901 Trichomonal vulvovaginitis: Secondary | ICD-10-CM | POA: Diagnosis not present

## 2015-07-31 DIAGNOSIS — Z79899 Other long term (current) drug therapy: Secondary | ICD-10-CM | POA: Diagnosis not present

## 2015-07-31 DIAGNOSIS — R1032 Left lower quadrant pain: Secondary | ICD-10-CM

## 2015-07-31 DIAGNOSIS — Z8719 Personal history of other diseases of the digestive system: Secondary | ICD-10-CM | POA: Insufficient documentation

## 2015-07-31 DIAGNOSIS — Z3202 Encounter for pregnancy test, result negative: Secondary | ICD-10-CM | POA: Diagnosis not present

## 2015-07-31 DIAGNOSIS — A599 Trichomoniasis, unspecified: Secondary | ICD-10-CM

## 2015-07-31 DIAGNOSIS — Z792 Long term (current) use of antibiotics: Secondary | ICD-10-CM | POA: Insufficient documentation

## 2015-07-31 DIAGNOSIS — R109 Unspecified abdominal pain: Secondary | ICD-10-CM | POA: Diagnosis present

## 2015-07-31 LAB — COMPREHENSIVE METABOLIC PANEL
ALK PHOS: 77 U/L (ref 38–126)
ALT: 12 U/L — ABNORMAL LOW (ref 14–54)
ANION GAP: 9 (ref 5–15)
AST: 15 U/L (ref 15–41)
Albumin: 3.7 g/dL (ref 3.5–5.0)
BILIRUBIN TOTAL: 0.5 mg/dL (ref 0.3–1.2)
BUN: 12 mg/dL (ref 6–20)
CALCIUM: 9 mg/dL (ref 8.9–10.3)
CO2: 24 mmol/L (ref 22–32)
Chloride: 108 mmol/L (ref 101–111)
Creatinine, Ser: 0.71 mg/dL (ref 0.44–1.00)
GFR calc Af Amer: >60 mL/min (ref >60)
Glucose, Bld: 106 mg/dL — ABNORMAL HIGH (ref 65–99)
Potassium: 3.4 mmol/L — ABNORMAL LOW (ref 3.5–5.1)
SODIUM: 141 mmol/L (ref 135–145)
TOTAL PROTEIN: 7.4 g/dL (ref 6.5–8.1)

## 2015-07-31 LAB — PREGNANCY, URINE: Preg Test, Ur: NEGATIVE

## 2015-07-31 LAB — URINALYSIS, ROUTINE W REFLEX MICROSCOPIC
Bilirubin Urine: NEGATIVE
Glucose, UA: NEGATIVE mg/dL
Ketones, ur: NEGATIVE mg/dL
NITRITE: NEGATIVE
PROTEIN: NEGATIVE mg/dL
Specific Gravity, Urine: 1.029 (ref 1.005–1.030)
pH: 5.5 (ref 5.0–8.0)

## 2015-07-31 LAB — CBC
HCT: 39.5 % (ref 36.0–46.0)
HEMOGLOBIN: 13.4 g/dL (ref 12.0–15.0)
MCH: 30.3 pg (ref 26.0–34.0)
MCHC: 33.9 g/dL (ref 30.0–36.0)
MCV: 89.4 fL (ref 78.0–100.0)
Platelets: 401 10*3/uL — ABNORMAL HIGH (ref 150–400)
RBC: 4.42 MIL/uL (ref 3.87–5.11)
RDW: 13.1 % (ref 11.5–15.5)
WBC: 14.9 10*3/uL — AB (ref 4.0–10.5)

## 2015-07-31 LAB — URINE MICROSCOPIC-ADD ON
Bacteria, UA: NONE SEEN
WBC, UA: NONE SEEN WBC/hpf (ref 0–5)

## 2015-07-31 LAB — WET PREP, GENITAL
Clue Cells Wet Prep HPF POC: NONE SEEN
Sperm: NONE SEEN
YEAST WET PREP: NONE SEEN

## 2015-07-31 LAB — LIPASE, BLOOD: Lipase: 23 U/L (ref 11–51)

## 2015-07-31 MED ORDER — DOXYCYCLINE HYCLATE 100 MG PO CAPS: 100.0000 mg | ORAL_CAPSULE | Freq: Two times a day (BID) | ORAL | Status: DC

## 2015-07-31 MED ORDER — METRONIDAZOLE 500 MG PO TABS: 500.0000 mg | ORAL_TABLET | Freq: Three times a day (TID) | ORAL | Status: DC

## 2015-07-31 MED ORDER — FLUCONAZOLE 150 MG PO TABS: 150.0000 mg | ORAL_TABLET | Freq: Every day | ORAL | Status: DC

## 2015-07-31 MED ORDER — CEFTRIAXONE SODIUM 250 MG IJ SOLR
250.0000 mg | Freq: Once | INTRAMUSCULAR | Status: AC
  Administered 2015-07-31: 250 mg via INTRAMUSCULAR
  Filled 2015-07-31: qty 250

## 2015-07-31 MED ORDER — AZITHROMYCIN 250 MG PO TABS
1000.0000 mg | ORAL_TABLET | Freq: Once | ORAL | Status: AC
  Administered 2015-07-31: 1000 mg via ORAL
  Filled 2015-07-31: qty 4

## 2015-07-31 MED ORDER — METRONIDAZOLE 500 MG PO TABS: 500.0000 mg | ORAL_TABLET | Freq: Two times a day (BID) | ORAL | Status: DC

## 2015-07-31 NOTE — ED Notes (Signed)
Pt. Stated, I've had stomach pain and vomiting since Friday.

## 2015-07-31 NOTE — Discharge Instructions (Signed)
Please read and follow all provided instructions.  Your diagnoses today include:  1. Left lower quadrant pain   2. LLQ pain   3. Trichomoniasis     Tests performed today include:  Blood cell counts and electrolytes - normal  Liver, kidney, pancreas function - normal  Test for gonorrhea and chlamydia.   Test for HIV.   You will be notified by telephone with any positive results.  Vital signs. See below for your results today.   Medications:  You were treated with azithromycin and rocephin today. These antibiotics treat you for gonorrhea and chlamydia. They do not treat for HIV or syphilis.   Home care instructions:  Read educational materials contained in this packet and follow any instructions provided.   You should tell your partners about your infection and avoid having sex for one week to allow time for the medicine to work.  Sexually transmitted disease testing also available at:   [facility] [place] [place]  [address], [place], phone [phone] or [phone]    Monday - Friday, call for an appointment  [facility] [place]  [address], [place], phone [phone] or [phone]   Monday - Friday, call for an appointment  Return instructions:   Please return to the Emergency Department if you experience worsening symptoms.   Please return if you have any other emergent concerns.  Additional Information:  Your vital signs today were: BP 109/55 mmHg   Pulse 55   Temp(Src) 98.4 F (36.9 C) (Oral)   Resp 20   Ht 5\' 3"  (1.6 m)   Wt 83.008 kg   BMI 32.43 kg/m2   SpO2 99%   LMP 07/26/2015 If your blood pressure (BP) was elevated above 135/85 this visit, please have this repeated by your doctor within one month. --------------

## 2015-07-31 NOTE — ED Provider Notes (Signed)
CSN: [id number]     Arrival date & time 07/31/15  [id number] History   First MD Initiated Contact with Patient 07/31/15 1032     Chief Complaint  Patient presents with  . Abdominal Pain  . Emesis     (Consider location/radiation/quality/duration/timing/severity/associated sxs/prior Treatment) HPI Comments: Patient with no past surgical history presents with complaint of back pain, stomach pain, dark urine, nausea with intermittent episodes of vomiting, occasional episodes of diarrhea, vaginal discharge and odor since the middle of last week (5 days ago). Abdominal pain described as intermittent cramping in the lower abdomen. She has had right-sided back pain. She denies hematuria. She has had some minor pain with urination described as a "pinching feeling". She has taken vinegar and Coconut oil for her symptoms without relief. The onset of this condition was acute. The course is constant. Aggravating factors: none. Alleviating factors: none.    Patient is a 24 y.o. female presenting with abdominal pain and vomiting. The history is provided by the patient.  Abdominal Pain Associated symptoms: diarrhea, dysuria, nausea, vaginal bleeding (currently on her period), vaginal discharge and vomiting   Associated symptoms: no chest pain, no cough, no fever, no hematuria and no sore throat   Emesis Associated symptoms: abdominal pain and diarrhea   Associated symptoms: no headaches, no myalgias and no sore throat     Past Medical History  Diagnosis Date  . Gastritis    History reviewed. No pertinent past surgical history. No family history on file. Social History  Substance Use Topics  . Smoking status: Never Smoker   . Smokeless tobacco: Never Used  . Alcohol Use: No   OB History    Gravida Para Term Preterm AB TAB SAB Ectopic Multiple Living   1         0     Review of Systems  Constitutional: Negative for fever.  HENT: Negative for rhinorrhea and sore throat.   Eyes: Negative for  redness.  Respiratory: Negative for cough.   Cardiovascular: Negative for chest pain.  Gastrointestinal: Positive for nausea, vomiting, abdominal pain and diarrhea. Negative for blood in stool.  Genitourinary: Positive for dysuria, vaginal bleeding (currently on her period) and vaginal discharge. Negative for hematuria, flank pain and difficulty urinating.  Musculoskeletal: Positive for back pain. Negative for myalgias.  Skin: Negative for rash.  Neurological: Negative for headaches.    Allergies  Review of patient's allergies indicates no known allergies.  Home Medications   Prior to Admission medications   Medication Sig Start Date End Date Taking? Authorizing Provider  amoxicillin (AMOXIL) 500 MG capsule Take 1 capsule (500 mg total) by mouth 3 (three) times daily. 02/07/14   Hope Orlene OchM Neese, NP  fish oil-omega-3 fatty acids 1000 MG capsule Take 2 g by mouth daily.    Historical Provider, MD  ibuprofen (ADVIL,MOTRIN) 600 MG tablet Take 1 tablet (600 mg total) by mouth every 6 (six) hours as needed. 02/07/14   Hope Orlene OchM Neese, NP  Prenatal Vit-Fe Fumarate-FA (PRENATAL MULTIVITAMIN) TABS Take 1 tablet by mouth daily.    Historical Provider, MD   BP 122/65 mmHg  Pulse 75  Temp(Src) 98.4 F (36.9 C) (Oral)  Resp 20  Ht 5\' 3"  (1.6 m)  Wt 83.008 kg  BMI 32.43 kg/m2  SpO2 99%  LMP 07/26/2015   Physical Exam  Constitutional: She appears well-developed and well-nourished.  HENT:  Head: Normocephalic and atraumatic.  Mouth/Throat: Oropharynx is clear and moist.  Eyes: Conjunctivae are normal. Right eye exhibits  no discharge. Left eye exhibits no discharge.  Neck: Normal range of motion. Neck supple.  Cardiovascular: Normal rate, regular rhythm and normal heart sounds.   Pulmonary/Chest: Effort normal and breath sounds normal. No respiratory distress. She has no wheezes. She has no rales.  Abdominal: Soft. Bowel sounds are normal. There is tenderness. There is no rebound and no guarding.   Minimal suprapubic and left lower quadrant tenderness.  Genitourinary: There is no rash or tenderness on the right labia. There is no rash or tenderness on the left labia. Cervix exhibits no motion tenderness. Right adnexum displays no mass and no tenderness. Left adnexum displays tenderness. Left adnexum displays no mass. There is bleeding in the vagina. No vaginal discharge found.  Neurological: She is alert.  Skin: Skin is warm and dry.  Psychiatric: She has a normal mood and affect.  Nursing note and vitals reviewed.   ED Course  Procedures (including critical care time) Labs Review Labs Reviewed  WET PREP, GENITAL - Abnormal; Notable for the following:    Trich, Wet Prep PRESENT (*)    WBC, Wet Prep HPF POC FEW (*)    All other components within normal limits  COMPREHENSIVE METABOLIC PANEL - Abnormal; Notable for the following:    Potassium 3.4 (*)    Glucose, Bld 106 (*)    ALT 12 (*)    All other components within normal limits  CBC - Abnormal; Notable for the following:    WBC 14.9 (*)    Platelets 401 (*)    All other components within normal limits  URINALYSIS, ROUTINE W REFLEX MICROSCOPIC (NOT AT [facility]) - Abnormal; Notable for the following:    Color, Urine AMBER (*)    APPearance TURBID (*)    Hgb urine dipstick LARGE (*)    Leukocytes, UA SMALL (*)    All other components within normal limits  URINE MICROSCOPIC-ADD ON - Abnormal; Notable for the following:    Squamous Epithelial / LPF 0-5 (*)    All other components within normal limits  LIPASE, BLOOD  PREGNANCY, URINE  HIV ANTIBODY (ROUTINE TESTING)  GC/CHLAMYDIA PROBE AMP ([place]) NOT AT [facility]    Imaging Review US Transvaginal Non-ob  07/31/2015  CLINICAL DATA:  Left lower quadrant pain for 2 days. EXAM: TRANSABDOMINAL AND TRANSVAGINAL ULTRASOUND OF PELVIS DOPPLER ULTRASOUND OF OVARIES TECHNIQUE: Both transabdominal and transvaginal ultrasound examinations of the pelvis were performed. Transabdominal  technique was performed for global imaging of the pelvis including uterus, ovaries, adnexal regions, and pelvic cul-de-sac. It was necessary to proceed with endovaginal exam following the transabdominal exam to visualize the ovaries and endometrial complex to an adequate degree. Color and duplex Doppler ultrasound was utilized to evaluate blood flow to the ovaries. COMPARISON:  None. FINDINGS: Uterus Measurements: 8.6 x 3.5 x 5 cm. No fibroids or other mass visualized. Endometrium Thickness: Normal at 5 mm. No mass or fluid identified within the endometrial canal. Right ovary Measurements: 2.6 x 2.3 x 2.4 cm. Right ovary appears normal containing small follicles. No mass or fluid identified in the adjacent right adnexal region. Left ovary Measurements: 2.7 x 1.5 x 2.47. Left ovary appears normal containing small follicles. No mass or free fluid identified in the adjacent left adnexal region. Pulsed Doppler evaluation of both ovaries demonstrates normal low-resistance arterial and venous waveforms. Other findings No free fluid. IMPRESSION: Normal transabdominal and transvaginal pelvic ultrasound. Electronically Signed   By: Bary Richard M.D.   On: 07/31/2015 13:18   US Pelvis  Complete  07/31/2015  CLINICAL DATA:  Left lower quadrant pain for 2 days. EXAM: TRANSABDOMINAL AND TRANSVAGINAL ULTRASOUND OF PELVIS DOPPLER ULTRASOUND OF OVARIES TECHNIQUE: Both transabdominal and transvaginal ultrasound examinations of the pelvis were performed. Transabdominal technique was performed for global imaging of the pelvis including uterus, ovaries, adnexal regions, and pelvic cul-de-sac. It was necessary to proceed with endovaginal exam following the transabdominal exam to visualize the ovaries and endometrial complex to an adequate degree. Color and duplex Doppler ultrasound was utilized to evaluate blood flow to the ovaries. COMPARISON:  None. FINDINGS: Uterus Measurements: 8.6 x 3.5 x 5 cm. No fibroids or other mass  visualized. Endometrium Thickness: Normal at 5 mm. No mass or fluid identified within the endometrial canal. Right ovary Measurements: 2.6 x 2.3 x 2.4 cm. Right ovary appears normal containing small follicles. No mass or fluid identified in the adjacent right adnexal region. Left ovary Measurements: 2.7 x 1.5 x 2.47. Left ovary appears normal containing small follicles. No mass or free fluid identified in the adjacent left adnexal region. Pulsed Doppler evaluation of both ovaries demonstrates normal low-resistance arterial and venous waveforms. Other findings No free fluid. IMPRESSION: Normal transabdominal and transvaginal pelvic ultrasound. Electronically Signed   By: Bary Richard M.D.   On: 07/31/2015 13:18   I have personally reviewed and evaluated these images and lab results as part of my medical decision-making.   EKG Interpretation None       10:48 AM Patient seen and examined. Work-up initiated. Medications ordered.   Vital signs reviewed and are as follows: BP 122/65 mmHg  Pulse 75  Temp(Src) 98.4 F (36.9 C) (Oral)  Resp 20  Ht  (1.6 m)  Wt 83.008 kg  BMI 32.43 kg/m2  SpO2 99%  LMP 07/26/2015  Pelvic exam performed with nurse chaperone. Given left-sided adnexal tenderness, patient sent for pelvic ultrasound. This was negative.  Workup is remarkable for Trichomonas and leukocytosis. Patient has blood in urine but this is likely contamination from menstrual period.  Will treat patient with azithromycin and Rocephin to cover for gonorrhea and chlamydia. Given left adnexal tenderness without other clear explanation of patient's pain, feel that she should undergo treatment for pelvic inflammatory disease.    The patient was urged to return to the Emergency Department immediately with worsening of current symptoms, worsening abdominal pain, persistent vomiting, blood noted in stools, fever, or any other concerns. The patient verbalized understanding.     MDM   Final  diagnoses:  Left lower quadrant pain  Trichomoniasis   Pt with abd pain, urinary sx, vomiting. Lower left quadrant/adnexal pain on exam. + trich. Will give PID treatment. Lower suspicion for enteritis, diverticulitis. Given constellation of findings, feel PID treatment indicated. No indication for admission. Do not feel that CT scan is necessary at this time. No peritonitis. Return instructions as above.    Renne Crigler, PA-C 07/31/15 1428  Courteney Randall An, MD 07/31/15 1552

## 2015-07-31 NOTE — ED Notes (Signed)
Patient transported to Ultrasound 

## 2015-08-01 LAB — GC/CHLAMYDIA PROBE AMP (~~LOC~~) NOT AT ARMC
Chlamydia: NEGATIVE
Neisseria Gonorrhea: NEGATIVE

## 2015-08-01 LAB — HIV ANTIBODY (ROUTINE TESTING W REFLEX): HIV Screen 4th Generation wRfx: NONREACTIVE

## 2015-09-11 ENCOUNTER — Emergency Department (HOSPITAL_COMMUNITY)
Admission: EM | Admit: 2015-09-11 | Discharge: 2015-09-11 | Disposition: A | Discharge status: Home / Self Care | Payer: Medicaid Other | Attending: Emergency Medicine | Admitting: Emergency Medicine

## 2015-09-11 ENCOUNTER — Encounter (HOSPITAL_COMMUNITY): Payer: Self-pay

## 2015-09-11 DIAGNOSIS — K5900 Constipation, unspecified: Secondary | ICD-10-CM | POA: Diagnosis not present

## 2015-09-11 DIAGNOSIS — R031 Nonspecific low blood-pressure reading: Secondary | ICD-10-CM | POA: Diagnosis not present

## 2015-09-11 DIAGNOSIS — K2970 Gastritis, unspecified, without bleeding: Secondary | ICD-10-CM | POA: Diagnosis not present

## 2015-09-11 DIAGNOSIS — R1013 Epigastric pain: Secondary | ICD-10-CM | POA: Diagnosis present

## 2015-09-11 LAB — URINALYSIS, ROUTINE W REFLEX MICROSCOPIC
BILIRUBIN URINE: NEGATIVE
Glucose, UA: NEGATIVE mg/dL
Hgb urine dipstick: NEGATIVE
KETONES UR: NEGATIVE mg/dL
Leukocytes, UA: NEGATIVE
Nitrite: NEGATIVE
Protein, ur: NEGATIVE mg/dL
Specific Gravity, Urine: 1.021 (ref 1.005–1.030)
pH: 6 (ref 5.0–8.0)

## 2015-09-11 LAB — COMPREHENSIVE METABOLIC PANEL
ALT: 13 U/L — AB (ref 14–54)
AST: 15 U/L (ref 15–41)
Albumin: 3.5 g/dL (ref 3.5–5.0)
Alkaline Phosphatase: 67 U/L (ref 38–126)
Anion gap: 7 (ref 5–15)
BUN: 14 mg/dL (ref 6–20)
CHLORIDE: 109 mmol/L (ref 101–111)
CO2: 25 mmol/L (ref 22–32)
CREATININE: 0.8 mg/dL (ref 0.44–1.00)
Calcium: 9 mg/dL (ref 8.9–10.3)
GFR calc non Af Amer: >60 mL/min (ref >60)
Glucose, Bld: 109 mg/dL — ABNORMAL HIGH (ref 65–99)
POTASSIUM: 3.8 mmol/L (ref 3.5–5.1)
SODIUM: 141 mmol/L (ref 135–145)
Total Bilirubin: <0.1 mg/dL — ABNORMAL LOW (ref 0.3–1.2)
Total Protein: 6.7 g/dL (ref 6.5–8.1)

## 2015-09-11 LAB — CBC
HEMATOCRIT: 40.2 % (ref 36.0–46.0)
Hemoglobin: 13.5 g/dL (ref 12.0–15.0)
MCH: 30.5 pg (ref 26.0–34.0)
MCHC: 33.6 g/dL (ref 30.0–36.0)
MCV: 90.7 fL (ref 78.0–100.0)
PLATELETS: 341 10*3/uL (ref 150–400)
RBC: 4.43 MIL/uL (ref 3.87–5.11)
RDW: 13 % (ref 11.5–15.5)
WBC: 10.7 10*3/uL — AB (ref 4.0–10.5)

## 2015-09-11 LAB — LIPASE, BLOOD: LIPASE: 26 U/L (ref 11–51)

## 2015-09-11 MED ORDER — SODIUM CHLORIDE 0.9 % IV BOLUS (SEPSIS)
1000.0000 mL | Freq: Once | INTRAVENOUS | Status: AC
  Administered 2015-09-11: 1000 mL via INTRAVENOUS

## 2015-09-11 MED ORDER — ONDANSETRON 4 MG PO TBDP: 4.0000 mg | ORAL_TABLET | Freq: Three times a day (TID) | ORAL | Status: DC | PRN

## 2015-09-11 MED ORDER — ONDANSETRON HCL 4 MG/2ML IJ SOLN
4.0000 mg | Freq: Once | INTRAMUSCULAR | Status: AC
Start: 2015-09-11 — End: 2015-09-11
  Administered 2015-09-11: 4 mg via INTRAVENOUS
  Filled 2015-09-11: qty 2

## 2015-09-11 MED ORDER — MAGNESIUM CITRATE PO SOLN
1.0000 | Freq: Once | ORAL | Status: AC
  Administered 2015-09-11: 1 via ORAL
  Filled 2015-09-11: qty 296

## 2015-09-11 MED ORDER — PANTOPRAZOLE SODIUM 40 MG IV SOLR
40.0000 mg | Freq: Once | INTRAVENOUS | Status: AC
  Administered 2015-09-11: 40 mg via INTRAVENOUS
  Filled 2015-09-11: qty 40

## 2015-09-11 MED ORDER — PANTOPRAZOLE SODIUM 40 MG PO TBEC: 40.0000 mg | DELAYED_RELEASE_TABLET | Freq: Every day | ORAL | Status: DC

## 2015-09-11 NOTE — ED Notes (Signed)
Pt reports abd pain and bowel pain, onset last night, hx of gastritis, and family hx of crohns and reports that type of pain, no n/v/d or issues with bowels noted.

## 2015-09-11 NOTE — Discharge Instructions (Signed)
1. Medications: Protonix is for your gastritis, zofran as needed for nausea/vomiting, continue any usual home medications 2. Treatment: rest, drink plenty of fluids, eat a bland diet for the next day or two such as toast, rice, yogurt, applesauce, bananas. Avoid spicy foods, alcohol, and caffeine.  3. Follow Up: Please follow up with your primary doctor in 2-3 days for discussion of your diagnoses and further evaluation after today's visit; if you do not have a primary care doctor use the resource guide provided to find one; Please return to the ER for any new or worsening symptoms, any additional concerns.    Emergency Department Resource Guide 1) Find a Doctor and Pay Out of Pocket Although you won't have to find out who is covered by your insurance plan, it is a good idea to ask around and get recommendations. You will then need to call the office and see if the doctor you have chosen will accept you as a new patient and what types of options they offer for patients who are self-pay. Some doctors offer discounts or will set up payment plans for their patients who do not have insurance, but you will need to ask so you aren't surprised when you get to your appointment.  2) Contact Your Local Health Department Not all health departments have doctors that can see patients for sick visits, but many do, so it is worth a call to see if yours does. If you don't know where your local health department is, you can check in your phone book. The CDC also has a tool to help you locate your state's health department, and many state websites also have listings of all of their local health departments.  3) Find a Walk-in Clinic If your illness is not likely to be very severe or complicated, you may want to try a walk in clinic. These are popping up all over the country in pharmacies, drugstores, and shopping centers. They're usually staffed by nurse practitioners or physician assistants that have been trained to  treat common illnesses and complaints. They're usually fairly quick and inexpensive. However, if you have serious medical issues or chronic medical problems, these are probably not your best option.  No Primary Care Doctor: - Call Health Connect at  [phone] - they can help you locate a primary care doctor that  accepts your insurance, provides certain services, etc. - Physician Referral Service- [phone]  Chronic Pain Problems: Organization         Address  Phone   Notes  [facility]  [phone] Patients need to be referred by their primary care doctor.   Medication Assistance: Organization         Address  Phone   Notes  [facility] Medication [facility] [address] [place] [place], [place] [address] [phone] --Must be a resident of [facility] -- Must have NO insurance coverage whatsoever (no Medicaid/ Medicare, etc.) -- The pt. MUST have a primary care doctor that directs their care regularly and follows them in the community   [facility]ist  [phone]   [employer]  [phone]    Agencies that provide inexpensive medical care: Organization         Address  Phone   Notes  [facility]  [phone]   [facility]    [phone]   [facility] [address] [place], [place] [address] [phone]   [facility] [address], [place] [phone]   [facility]anned Parenthood    [phone]) [phone]   [facility]    [phone]) [phone]   [facility]  [address], [place] Phone:  [phone], Fax:  [phone](336) [phone] Hours of Operation:  9 am - 6 pm, M-F.  Also accepts Medicaid/Medicare and self-pay.  [facility]  [address], [place] Phone: [phone], Fax: [phone]. Hours of Operation:  8:30 am - 5:30 pm, M-F.  Also accepts Medicaid and self-pay.  [facility]  [place] [address], [place] Phone: [phone]) [phone]   [facility] [address], [place], [place] [phone], Ext. [phone] Mondays & Thursdays: 7-9 AM.  First 15 patients are seen on a first come, first serve basis.    Medicaid-accepting [facility] Providers:  Organization         Address  Phone   Notes  [facility] [address], [place] [phone] Also accepts self-pay patients.  [facility] [address], [place]  [phone]   [facility] [address], [place] [phone]) [phone]   [facility] [address] [place] [address], [place] [phone]) [phone]   Renaye RakersVeita Bland [address], [place]   [phone] Only accepts [place] Access [place] patients after they have their name applied to their card.   Self-Pay (no insurance) in [facility]:  Organization         Address  Phone   Notes  Sickle Cell Patients, [facility] [address], [place] [phone]) [phone]   [facility] [address], [place] [phone]) [phone]   [facility]  [address], [place] [phone]   [facility]/Dr. Osei-Bonsu  [address] [place] [address], [place] or [phone], [place] [phone] Phone number for both [place] and [place] locations is the same.  [facility] [address], [place] [phone]) [phone]   [facility] [address] [place] [address], [place] or [address] [phone]) [phone]) [phone]   [facility] [address], [place] [phone]) [phone], phone; [phone]) [phone], fax Sees patients 1st and 3rd Saturday of every month.  Must not qualify for public or private insurance (i.e. Medicaid, Medicare, [place] Health Choice, Veterans' Benefits)  Household income should be no more than 200%  of the poverty level The clinic cannot treat you if you are pregnant or think you are pregnant  Sexually transmitted diseases are not treated at the clinic.    Dental Care: Organization         Address  Phone  Notes  [facility] of [facility] [address], [place] [phone]) [phone] Accepts children up to age 24 who are enrolled in [place] or [place] Health Choice; pregnant women with a Medicaid card; and children who have applied for Medicaid or [place] Health Choice, but were declined, whose parents can pay a reduced fee at time of service.  [facility] of [facility] [place]  [address], [place] [phone] Accepts children up to age 24 who are enrolled in [place] or [place] Health Choice; pregnant women with a Medicaid card; and children who  have applied for Medicaid or [place] Health Choice, but were declined, whose parents can pay a reduced fee at time of service.  [facility]  [address] [place], [place] [phone] Patients are seen by appointment only. Walk-ins are not accepted. [facility] will see patients 41 years of age and older. Monday - Tuesday (8am-5pm) Most Wednesdays (8:30-5pm) $30 per visit, cash only  [facility]  [address], [facility] [phone] Patients are seen by appointment only. Walk-ins are not accepted. [facility] will see patients 92 years of age and older. One Wednesday Evening (Monthly: Volunteer Based).  $30 per visit, cash only  [employer]  [phone] for adults; Children under age 17, call Graduate Pediatric Dentistry at [phone]. Children aged 42-14, please call [phone] to request a pediatric application.  Dental services are provided in all areas of dental care including fillings, crowns and bridges, complete and partial dentures, implants, gum treatment, root canals, and  extractions. Preventive care is also provided. Treatment is provided to both adults and children. Patients are selected via a lottery and there is often a waiting list.   [facility] [address], [place]  [phone] [email or web address]   [facility] [address] [place], [place] [phone], Ext. [phone] Second and Fourth Thursday of each month, opens at 6:30 AM; Clinic ends at 9 AM.  Patients are seen on a first-come first-served basis, and a limited number are seen during each clinic.   [facility]  [address] [place], [place] [phone]   Eligibility Requirements You must have lived in [place], [place], or [place] counties for at least the last three months.   You cannot be eligible for state or federal sponsored [place], including [employer], [place], or [employer].   You generally cannot be eligible for healthcare insurance through your employer.    How to apply: Eligibility screenings are held every Tuesday and Wednesday afternoon from 1:00 pm until 4:00 pm. You do not need an appointment for the interview!  [facility] [address], [place], [place] [phone]   [facility] Health Department  [phone]   [facility] Health Department  [phone]   [facility] Health Department  [phone]    Behavioral Health Resources in the Community: Intensive Outpatient Programs Organization         Address  Phone  Notes  [facility] [address], [place], [place] [phone]   [facility] [address], [place], [place] [phone]   [facility] [address] [place], [place]  [phone]   [facility] Mental Health [address],  [address] [place], [place] [phone] or [phone]   Substance Abuse Resources Organization         Address  Phone  Notes  [facility] Drug Services  [phone]     [facility]  [phone]   [facility] [place]  [phone]   Floydene Flock  [phone]   Residential & Outpatient Substance Abuse Program  [phone]   Psychological Services Organization         Address  Phone  Notes  [facility] Behavioral Health  [phone]   [facility] Services  [phone]   [facility] [address], [place] [phone] or [phone]    Mobile Crisis Teams Organization  Address  Phone  Notes  [facility], Mobile Crisis Care Unit  [phone]   [facility]  [address] [place], [place] [phone]   [facility] [address] [place] [phone]    Self-Help/Support Groups Organization         Address  Phone             Notes  [facility] [place] - variety of support groups  [phone]- I7437963 Call for more information  Narcotics Anonymous (NA), [facility] [address], [employer] [place]  2 meetings at this location   Statistician         Address  Phone  Notes  [facility] [address],    [place]  [phone]   [facility]  [address], [place] [address], [place], [place] [phone]   [facility] [address] [place], [place] [phone] Admissions: 8am-3pm M-F  [facility] [address],    [place], [place] [phone]   [facility] [address] [place], [place], [place] [phone]   [facility] [address],  [place], [place] [phone]   [facility] - Intensive Outpatient [address], [place], [place] [phone]   [facility] [address] [place]  [place], [place] [phone] or [phone]   [facility] ([facility]) [address], [place], [place] [phone] Accepts Medicaid  [facility] [address],   [place] [phone] Substance Abuse/Addiction Treatment   [facility] Organization         Address  Phone  Notes  [facility]  [phone]   Angie Fava, PhD [address] [place], [place]   [phone] or ([phone]   [facility]   [address] [place], [place] [phone]   [facility] [address], [place], [place] [phone] Insurance/Medicaid/sponsorship through [facility] [address]                                    [place], [place] [phone] Therapy/tele-psych/case  [facility] [address], [place] [phone]    Dr. Lolly Mustache  [phone]   [facility] [place]  [employer] [facility] Dept. 1) [address], [place] 2) [address], [place] 3)  [address], [place] [phone]  [phone]   [facility] Child Abuse Hotline (972) 460-4077 or [phone]) [phone] (After Hours)

## 2015-09-11 NOTE — ED Provider Notes (Signed)
CSN: [id number]     Arrival date & time 09/11/15  0620 History   First MD Initiated Contact with Patient 09/11/15 [phone]     Chief Complaint  Patient presents with  . Abdominal Pain     (Consider location/radiation/quality/duration/timing/severity/associated sxs/prior Treatment) Patient is a 25 y.o. female presenting with abdominal pain. The history is provided by the patient and medical records. No language interpreter was used.  Abdominal Pain Associated symptoms: constipation, nausea and vomiting   Associated symptoms: no cough, no diarrhea, no dysuria, no shortness of breath, no sore throat and no vaginal discharge      Kelly Barnes is a 25 y.o. female  with a PMH of gastritis who presents to the Emergency Department complaining of intermittent, non-radiating, sharp epigastric pain x 2 days. Associated symptoms include nausea, three episodes of NBNB emesis. Patient states last episode of emesis was last night after eating tacos from [facility]. Also admits to constipation, stating last bowel movement was approx. Wednesday and she usually has BM's daily or every other day. Denies fever, chest pain, shortness of breath.   Past Medical History  Diagnosis Date  . Gastritis    History reviewed. No pertinent past surgical history. History reviewed. No pertinent family history. Social History  Substance Use Topics  . Smoking status: Never Smoker   . Smokeless tobacco: Never Used  . Alcohol Use: No   OB History    Gravida Para Term Preterm AB TAB SAB Ectopic Multiple Living   1         0     Review of Systems  Constitutional: Negative.   HENT: Negative for congestion, rhinorrhea and sore throat.   Eyes: Negative for visual disturbance.  Respiratory: Negative for cough, shortness of breath and wheezing.   Cardiovascular: Negative.   Gastrointestinal: Positive for nausea, vomiting, abdominal pain and constipation. Negative for diarrhea.  Genitourinary: Negative for dysuria and  vaginal discharge.  Musculoskeletal: Negative for myalgias, back pain, arthralgias and neck pain.  Skin: Negative for rash.  Neurological: Negative for dizziness, weakness and headaches.      Allergies  Review of patient's allergies indicates no known allergies.  Home Medications   Prior to Admission medications   Medication Sig Start Date End Date Taking? Authorizing Provider  ondansetron (ZOFRAN ODT) 4 MG disintegrating tablet Take 1 tablet (4 mg total) by mouth every 8 (eight) hours as needed for nausea or vomiting. 09/11/15   Kelly Barnes Noell Lorensen, PA-C  pantoprazole (PROTONIX) 40 MG tablet Take 1 tablet (40 mg total) by mouth daily. 09/11/15   Kelly Barnes Kelly Lindh, PA-C   BP 116/62 mmHg  Pulse 71  Temp(Src) 98 F (36.7 C) (Oral)  Resp 16  SpO2 100% Physical Exam  Constitutional: She is oriented to person, place, and time. She appears well-developed and well-nourished.  Alert and in no acute distress  HENT:  Head: Normocephalic and atraumatic.  Cardiovascular: Normal rate, regular rhythm and normal heart sounds.  Exam reveals no gallop and no friction rub.   No murmur heard. Pulmonary/Chest: Effort normal and breath sounds normal. No respiratory distress. She has no wheezes. She has no rales. She exhibits no tenderness.  Abdominal: She exhibits no mass. There is no rebound and no guarding.  Abdomen soft, non-distended TTP of epigastrium Bowel sounds positive in all four quadrants  Musculoskeletal: She exhibits no edema.  Neurological: She is alert and oriented to person, place, and time.  Skin: Skin is warm and dry. No rash noted.  Psychiatric:  She has a normal mood and affect. Her behavior is normal. Judgment and thought content normal.  Nursing note and vitals reviewed.   ED Course  Procedures (including critical care time) Labs Review Labs Reviewed  COMPREHENSIVE METABOLIC PANEL - Abnormal; Notable for the following:    Glucose, Bld 109 (*)    ALT 13 (*)    Total  Bilirubin <0.1 (*)    All other components within normal limits  CBC - Abnormal; Notable for the following:    WBC 10.7 (*)    All other components within normal limits  URINALYSIS, ROUTINE W REFLEX MICROSCOPIC (NOT AT [facility]) - Abnormal; Notable for the following:    APPearance CLOUDY (*)    All other components within normal limits  LIPASE, BLOOD    Imaging Review No results found. I have personally reviewed and evaluated these images and lab results as part of my medical decision-making.   EKG Interpretation None      MDM   Final diagnoses:  Gastritis  Low blood pressure reading   Kelly ChallengerJulissa Barnes is a 25 y.o. female  with a PMH of gastritis who presents to the Emergency Department for epigastric pain. Lipase, CMP, CBC, and UA reviewed and are reassuring.   Therapeutics: zofran, protonix, 1L IVF  10:10 AM - Patient re-evaluated after meds and feels much improved. Will give bottle of mag for constipation and PO challenge 10:53 AM - Patient re-evaluated, tolerated PO well, no emesis in ED, no complaints at this time. BP was noted to be mildly hypotensive, will give one more liter of fluids then reassess.   BP improved.  A&P: Gastritis   - Protonix, zofran  - PCP follow up (resource guide given) Constipation  - Miralax regimen instructions given  - Increase hydration, high fiber diet  - PCP follow up   [facility]ime Barnes [place], PA-C 09/11/15 1310  Bethann BerkshireJoseph Zammit, MD 09/13/15 [phone]

## 2018-08-05 ENCOUNTER — Inpatient Hospital Stay (HOSPITAL_COMMUNITY)
Admission: AD | Admit: 2018-08-05 | Discharge: 2018-08-05 | Disposition: A | Discharge status: Home / Self Care | Payer: Self-pay | Attending: Obstetrics & Gynecology | Admitting: Obstetrics & Gynecology

## 2018-08-05 ENCOUNTER — Encounter (HOSPITAL_COMMUNITY): Payer: Self-pay | Admitting: *Deleted

## 2018-08-05 DIAGNOSIS — R109 Unspecified abdominal pain: Secondary | ICD-10-CM | POA: Insufficient documentation

## 2018-08-05 DIAGNOSIS — B9689 Other specified bacterial agents as the cause of diseases classified elsewhere: Secondary | ICD-10-CM | POA: Insufficient documentation

## 2018-08-05 DIAGNOSIS — Z3202 Encounter for pregnancy test, result negative: Secondary | ICD-10-CM | POA: Insufficient documentation

## 2018-08-05 DIAGNOSIS — R110 Nausea: Secondary | ICD-10-CM

## 2018-08-05 DIAGNOSIS — N760 Acute vaginitis: Secondary | ICD-10-CM | POA: Insufficient documentation

## 2018-08-05 LAB — URINALYSIS, ROUTINE W REFLEX MICROSCOPIC
Bilirubin Urine: NEGATIVE
GLUCOSE, UA: NEGATIVE mg/dL
HGB URINE DIPSTICK: NEGATIVE
Ketones, ur: NEGATIVE mg/dL
NITRITE: NEGATIVE
PH: 6 (ref 5.0–8.0)
PROTEIN: NEGATIVE mg/dL
Specific Gravity, Urine: 1.02 (ref 1.005–1.030)

## 2018-08-05 LAB — WET PREP, GENITAL
Sperm: NONE SEEN
TRICH WET PREP: NONE SEEN
YEAST WET PREP: NONE SEEN

## 2018-08-05 LAB — POCT PREGNANCY, URINE: Preg Test, Ur: NEGATIVE

## 2018-08-05 LAB — CBC
HCT: 40.7 % (ref 36.0–46.0)
Hemoglobin: 13.8 g/dL (ref 12.0–15.0)
MCH: 31.1 pg (ref 26.0–34.0)
MCHC: 33.9 g/dL (ref 30.0–36.0)
MCV: 91.7 fL (ref 80.0–100.0)
NRBC: 0 % (ref 0.0–0.2)
PLATELETS: 408 10*3/uL — AB (ref 150–400)
RBC: 4.44 MIL/uL (ref 3.87–5.11)
RDW: 12.3 % (ref 11.5–15.5)
WBC: 13.7 10*3/uL — ABNORMAL HIGH (ref 4.0–10.5)

## 2018-08-05 MED ORDER — ONDANSETRON 8 MG PO TBDP
8.0000 mg | ORAL_TABLET | Freq: Once | ORAL | Status: AC
Start: 2018-08-05 — End: 2018-08-05
  Administered 2018-08-05: 8 mg via ORAL
  Filled 2018-08-05: qty 1

## 2018-08-05 MED ORDER — METRONIDAZOLE 500 MG PO TABS: 500.0000 mg | ORAL_TABLET | Freq: Two times a day (BID) | ORAL | 0 refills | Status: DC

## 2018-08-05 MED ORDER — KETOROLAC TROMETHAMINE 60 MG/2ML IM SOLN
60.0000 mg | Freq: Once | INTRAMUSCULAR | Status: AC
  Administered 2018-08-05: 60 mg via INTRAMUSCULAR
  Filled 2018-08-05: qty 2

## 2018-08-05 NOTE — MAU Note (Signed)
Pt had period the week of Thanksgiving, had some spotting a few days after, has been vomiting for the last 2 weeks, having hot flashes.  Having lower abdominal pain, shoots down into her L leg.  Denies bleeding today.

## 2018-08-05 NOTE — Discharge Instructions (Signed)
In late 2019, the [facility] will be moving to the [place] [facility]. At that time, the MAU (Maternity Admissions Unit), where you are being seen today, will no longer take care of non-pregnant patients. We strongly encourage you to find a doctor's office before that time, so that you can be seen with any GYN concerns, like vaginal discharge, urinary tract infection, etc.. in a timely manner. ° °In order to make an office visit more convenient, the [facility] will be offering evening hours with same-day appointments, walk-in appointments and scheduled appointments available during this time. ° °[facility] Hours: °Monday - 8am - 7:30 pm with walk-in between 4pm- 7:30 pm °Tuesday - 8 am - 5 pm (starting 11/26/17 we will be open late and accepting walk-ins from 4pm - 7:30pm) °Wednesday - 8 am - 5 pm (starting 02/26/18 we will be open late and accepting walk-ins from 4pm - 7:30pm) °Thursday 8 am - 5 pm (starting 05/29/18 we will be open late and accepting walk-ins from 4pm - 7:30pm) °Friday 8 am - 5 pm ° °For an appointment please call the [facility] at [phone] ° °For urgent needs,  Urgent Care is also available for management of urgent GYN complaints such as vaginal discharge or urinary tract infections. ° ° ° ° ° ° °Bacterial Vaginosis °Bacterial vaginosis is a vaginal infection that occurs when the normal balance of bacteria in the vagina is disrupted. It results from an overgrowth of certain bacteria. This is the most common vaginal infection among women ages 15-44. °Because bacterial vaginosis increases your risk for STIs (sexually transmitted infections), getting treated can help reduce your risk for chlamydia, gonorrhea, herpes, and HIV (human immunodeficiency virus). Treatment is also important for preventing complications in pregnant women, because this condition can cause an early  (premature) delivery. °What are the causes? °This condition is caused by an increase in harmful bacteria that are normally present in small amounts in the vagina. However, the reason that the condition develops is not fully understood. °What increases the risk? °The following factors may make you more likely to develop this condition: °· Having a new sexual partner or multiple sexual partners. °· Having unprotected sex. °· Douching. °· Having an intrauterine device (IUD). °· Smoking. °· Drug and alcohol abuse. °· Taking certain antibiotic medicines. °· Being pregnant. ° °You cannot get bacterial vaginosis from toilet seats, bedding, swimming pools, or contact with objects around you. °What are the signs or symptoms? °Symptoms of this condition include: °· Grey or white vaginal discharge. The discharge can also be watery or foamy. °· A fish-like odor with discharge, especially after sexual intercourse or during menstruation. °· Itching in and around the vagina. °· Burning or pain with urination. ° °Some women with bacterial vaginosis have no signs or symptoms. °How is this diagnosed? °This condition is diagnosed based on: °· Your medical history. °· A physical exam of the vagina. °· Testing a sample of vaginal fluid under a microscope to look for a large amount of bad bacteria or abnormal cells. Your health care provider may use a cotton swab or a small wooden spatula to collect the sample. ° °How is this treated? °This condition is treated with antibiotics. These may be given as a pill, a vaginal cream, or a medicine that is put into the vagina (suppository). If the condition comes back after treatment, a second round of antibiotics may   be needed. °Follow these instructions at home: °Medicines °· Take over-the-counter and prescription medicines only as told by your health care provider. °· Take or use your antibiotic as told by your health care provider. Do not stop taking or using the antibiotic even if you start  to feel better. °General instructions °· If you have a female sexual partner, tell her that you have a vaginal infection. She should see her health care provider and be treated if she has symptoms. If you have a female sexual partner, he does not need treatment. °· During treatment: °? Avoid sexual activity until you finish treatment. °? Do not douche. °? Avoid alcohol as directed by your health care provider. °? Avoid breastfeeding as directed by your health care provider. °· Drink enough water and fluids to keep your urine clear or pale yellow. °· Keep the area around your vagina and rectum clean. °? Wash the area daily with warm water. °? Wipe yourself from front to back after using the toilet. °· Keep all follow-up visits as told by your health care provider. This is important. °How is this prevented? °· Do not douche. °· Wash the outside of your vagina with warm water only. °· Use protection when having sex. This includes latex condoms and dental dams. °· Limit how many sexual partners you have. To help prevent bacterial vaginosis, it is best to have sex with just one partner (monogamous). °· Make sure you and your sexual partner are tested for STIs. °· Wear cotton or cotton-lined underwear. °· Avoid wearing tight pants and pantyhose, especially during summer. °· Limit the amount of alcohol that you drink. °· Do not use any products that contain nicotine or tobacco, such as cigarettes and e-cigarettes. If you need help quitting, ask your health care provider. °· Do not use illegal drugs. °Where to find more information: °· Centers for Disease Control and Prevention: www.cdc.gov/std °· American Sexual Health Association (ASHA): www.ashastd.org °· U.S. Department of Health and Human Services, Office on Women's Health: www.womenshealth.gov/ or https://www.womenshealth.gov/a-z-topics/bacterial-vaginosis °Contact a health care provider if: °· Your symptoms do not improve, even after treatment. °· You have more  discharge or pain when urinating. °· You have a fever. °· You have pain in your abdomen. °· You have pain during sex. °· You have vaginal bleeding between periods. °Summary °· Bacterial vaginosis is a vaginal infection that occurs when the normal balance of bacteria in the vagina is disrupted. °· Because bacterial vaginosis increases your risk for STIs (sexually transmitted infections), getting treated can help reduce your risk for chlamydia, gonorrhea, herpes, and HIV (human immunodeficiency virus). Treatment is also important for preventing complications in pregnant women, because the condition can cause an early (premature) delivery. °· This condition is treated with antibiotic medicines. These may be given as a pill, a vaginal cream, or a medicine that is put into the vagina (suppository). °This information is not intended to replace advice given to you by your health care provider. Make sure you discuss any questions you have with your health care provider. °Document Released: 08/13/2005 Document Revised: 12/17/2016 Document Reviewed: 04/28/2016 °Elsevier Interactive Patient Education © 2018 [employer] ° °

## 2018-08-05 NOTE — Progress Notes (Signed)
Vaginal cultures obtained & to lab.

## 2018-08-05 NOTE — MAU Provider Note (Signed)
History     CSN: [id number]  Arrival date and time: 08/05/18 [id number]   First Provider Initiated Contact with Patient 08/05/18 [phone]      Chief Complaint  Patient presents with  . Abdominal Pain  . Hot Flashes   HPI Kelly Barnes is a 27 y.o. [id number] non pregnant female who presents with cramping and nausea. She states she is trying to get pregnant and wants a pregnancy test. She reports an LMP around 11/28 but the symptoms started after. She has not taken a pregnancy test. She reports nausea in the morning and forcing herself to throw up to feel better. She also reports lower abdominal cramping that she rates a 4/10 and hasn't tried anything for.   OB History    Gravida  3   Para  2   Term  2   Preterm      AB  1   Living  2     SAB  1   TAB      Ectopic      Multiple      Live Births  2           Past Medical History:  Diagnosis Date  . Gastritis     Past Surgical History:  Procedure Laterality Date  . NO PAST SURGERIES      Family History  Problem Relation Age of Onset  . Lupus Mother   . Diabetes Father     Social History   Tobacco Use  . Smoking status: Never Smoker  . Smokeless tobacco: Never Used  Substance Use Topics  . Alcohol use: No  . Drug use: Yes    Types: Marijuana    Comment: September 2019    Allergies: No Known Allergies  Medications Prior to Admission  Medication Sig Dispense Refill Last Dose  . ondansetron (ZOFRAN ODT) 4 MG disintegrating tablet Take 1 tablet (4 mg total) by mouth every 8 (eight) hours as needed for nausea or vomiting. 12 tablet 0   . pantoprazole (PROTONIX) 40 MG tablet Take 1 tablet (40 mg total) by mouth daily. 30 tablet 0     Review of Systems  Constitutional: Negative.  Negative for fatigue and fever.  HENT: Negative.   Respiratory: Negative.  Negative for shortness of breath.   Cardiovascular: Negative.  Negative for chest pain.  Gastrointestinal: Positive for abdominal pain and nausea.  Negative for constipation, diarrhea and vomiting.  Genitourinary: Negative.  Negative for dysuria, vaginal bleeding and vaginal discharge.  Neurological: Negative.  Negative for dizziness and headaches.   Physical Exam   Blood pressure 135/76, pulse 86, temperature 98.1 F (36.7 C), temperature source Oral, resp. rate 16, height 5\' 3"  (1.6 m), weight 95.3 kg, last menstrual period 07/22/2018, unknown if currently breastfeeding.  Physical Exam  Nursing note and vitals reviewed. Constitutional: She is oriented to person, place, and time. She appears well-developed and well-nourished. No distress.  HENT:  Head: Normocephalic.  Eyes: Pupils are equal, round, and reactive to light.  Cardiovascular: Normal rate, regular rhythm and normal heart sounds.  Respiratory: Effort normal and breath sounds normal. No respiratory distress.  GI: Soft. Bowel sounds are normal. She exhibits no distension. There is no tenderness. There is no rebound and no guarding.  Neurological: She is alert and oriented to person, place, and time.  Skin: Skin is warm and dry.  Psychiatric: She has a normal mood and affect. Her behavior is normal. Judgment and thought content normal.    MAU  Course  Procedures Results for orders placed or performed during the hospital encounter of 08/05/18 (from the past 24 hour(s))  Urinalysis, Routine w reflex microscopic     Status: Abnormal   Collection Time: 08/05/18  8:54 AM  Result Value Ref Range   Color, Urine YELLOW YELLOW   APPearance HAZY (A) CLEAR   Specific Gravity, Urine 1.020 1.005 - 1.030   pH 6.0 5.0 - 8.0   Glucose, UA NEGATIVE NEGATIVE mg/dL   Hgb urine dipstick NEGATIVE NEGATIVE   Bilirubin Urine NEGATIVE NEGATIVE   Ketones, ur NEGATIVE NEGATIVE mg/dL   Protein, ur NEGATIVE NEGATIVE mg/dL   Nitrite NEGATIVE NEGATIVE   Leukocytes, UA MODERATE (A) NEGATIVE   RBC / HPF 0-5 0 - 5 RBC/hpf   WBC, UA 21-50 0 - 5 WBC/hpf   Bacteria, UA RARE (A) NONE SEEN    Squamous Epithelial / LPF 0-5 0 - 5   Mucus PRESENT   Pregnancy, urine POC     Status: None   Collection Time: 08/05/18  8:58 AM  Result Value Ref Range   Preg Test, Ur NEGATIVE NEGATIVE  CBC     Status: Abnormal   Collection Time: 08/05/18  9:35 AM  Result Value Ref Range   WBC 13.7 (H) 4.0 - 10.5 K/uL   RBC 4.44 3.87 - 5.11 MIL/uL   Hemoglobin 13.8 12.0 - 15.0 g/dL   HCT 54.0 98.1 - 19.1 %   MCV 91.7 80.0 - 100.0 fL   MCH 31.1 26.0 - 34.0 pg   MCHC 33.9 30.0 - 36.0 g/dL   RDW 47.8 29.5 - 62.1 %   Platelets 408 (H) 150 - 400 K/uL   nRBC 0.0 0.0 - 0.2 %  Wet prep, genital     Status: Abnormal   Collection Time: 08/05/18  9:44 AM  Result Value Ref Range   Yeast Wet Prep HPF POC NONE SEEN NONE SEEN   Trich, Wet Prep NONE SEEN NONE SEEN   Clue Cells Wet Prep HPF POC PRESENT (A) NONE SEEN   WBC, Wet Prep HPF POC MANY (A) NONE SEEN   Sperm NONE SEEN    MDM UA, UPT, UC CBC Wet prep and gc/chlamydia Toradol IM Zofran ODT  Low suspicion for appendicitis due to location of pain and absence of fever, pain location and lack of surgical abdomen  Patient reports relief of nausea and pain. Possible UTI due to moderate leukocytes, will culture.   Assessment and Plan   1. Negative pregnancy test   2. Nausea   3. Bacterial vaginosis    -Discharge home in stable condition -Rx for metronidazole sent to patient's pharmacy -Pregnancy precautions discussed -Patient advised to follow-up with PCP if symptoms continue -Patient may return to MAU as needed or if her condition were to change or worsen  Rolm Bookbinder CNM 08/05/2018, 10:12 AM

## 2018-08-06 LAB — URINE CULTURE: CULTURE: NO GROWTH

## 2018-08-06 LAB — GC/CHLAMYDIA PROBE AMP (~~LOC~~) NOT AT ARMC
CHLAMYDIA, DNA PROBE: POSITIVE — AB
NEISSERIA GONORRHEA: NEGATIVE

## 2018-08-08 ENCOUNTER — Telehealth: Payer: Self-pay | Admitting: Student

## 2018-08-08 DIAGNOSIS — A749 Chlamydial infection, unspecified: Secondary | ICD-10-CM

## 2018-08-08 MED ORDER — AZITHROMYCIN 500 MG PO TABS: 1000.0000 mg | ORAL_TABLET | Freq: Once | ORAL | 0 refills | Status: AC

## 2018-08-08 NOTE — Telephone Encounter (Addendum)
Kelly ChallengerJulissa Barnes tested positive for  Chlamydia. Patient was called by RN and allergies and pharmacy confirmed. Rx sent to pharmacy of choice.   Kelly HornLawrence, Kelly Laino, NP 08/08/2018 1:11 PM        ----- Message from Kelly BectonLori S Berdik, RN sent at 08/08/2018 11:51 AM EST ----- This patient tested positive for :  chlamydia  She :"has NKDA", I have informed the patient of her results and confirmed her pharmacy is correct in her chart. Please send Rx.   Thank you,   Kelly BectonBerdik, Kelly S, RN   [facility]

## 2020-07-06 ENCOUNTER — Encounter (HOSPITAL_COMMUNITY): Payer: Self-pay | Admitting: Obstetrics and Gynecology

## 2020-07-06 ENCOUNTER — Other Ambulatory Visit: Payer: Self-pay

## 2020-07-06 ENCOUNTER — Inpatient Hospital Stay (HOSPITAL_COMMUNITY)
Admission: AD | Admit: 2020-07-06 | Discharge: 2020-07-06 | Disposition: A | Discharge status: Home / Self Care | Payer: Medicaid Other | Attending: Obstetrics and Gynecology | Admitting: Obstetrics and Gynecology

## 2020-07-06 DIAGNOSIS — O98311 Other infections with a predominantly sexual mode of transmission complicating pregnancy, first trimester: Secondary | ICD-10-CM | POA: Insufficient documentation

## 2020-07-06 DIAGNOSIS — Z3A01 Less than 8 weeks gestation of pregnancy: Secondary | ICD-10-CM | POA: Insufficient documentation

## 2020-07-06 DIAGNOSIS — O26891 Other specified pregnancy related conditions, first trimester: Secondary | ICD-10-CM | POA: Insufficient documentation

## 2020-07-06 DIAGNOSIS — O219 Vomiting of pregnancy, unspecified: Secondary | ICD-10-CM | POA: Insufficient documentation

## 2020-07-06 DIAGNOSIS — R109 Unspecified abdominal pain: Secondary | ICD-10-CM

## 2020-07-06 DIAGNOSIS — A5901 Trichomonal vulvovaginitis: Secondary | ICD-10-CM

## 2020-07-06 DIAGNOSIS — O3680X0 Pregnancy with inconclusive fetal viability, not applicable or unspecified: Secondary | ICD-10-CM | POA: Insufficient documentation

## 2020-07-06 DIAGNOSIS — O26851 Spotting complicating pregnancy, first trimester: Secondary | ICD-10-CM | POA: Insufficient documentation

## 2020-07-06 LAB — URINALYSIS, ROUTINE W REFLEX MICROSCOPIC
Bacteria, UA: NONE SEEN
Bilirubin Urine: NEGATIVE
Glucose, UA: NEGATIVE mg/dL
Hgb urine dipstick: NEGATIVE
Ketones, ur: NEGATIVE mg/dL
Nitrite: NEGATIVE
Protein, ur: NEGATIVE mg/dL
Specific Gravity, Urine: 1.014 (ref 1.005–1.030)
pH: 6 (ref 5.0–8.0)

## 2020-07-06 LAB — HCG, QUANTITATIVE, PREGNANCY: hCG, Beta Chain, Quant, S: 115 m[IU]/mL — ABNORMAL HIGH (ref <5)

## 2020-07-06 LAB — POCT PREGNANCY, URINE: Preg Test, Ur: POSITIVE — AB

## 2020-07-06 MED ORDER — METRONIDAZOLE 500 MG PO TABS
2000.0000 mg | ORAL_TABLET | Freq: Once | ORAL | Status: AC
  Administered 2020-07-06: 2000 mg via ORAL
  Filled 2020-07-06: qty 4

## 2020-07-06 NOTE — MAU Note (Addendum)
.   Kelly Barnes is a 29 y.o. at [redacted]w[redacted]d here in MAU reporting: she went to [facility] ED on Sunday with complaints of vaginal bleeding and pain in her upper abdomen. Reports pink discharge today with upper abdominal pain and N/V.  Pt states they treated her Chlamydia but told her that her OB would have to treat other STD [facility] LMP: 05/30/2020 Onset of complaint: Sunday Pain score: 7 Vitals:   07/06/20 0835  BP: (!) 152/81  Pulse: 94  Resp: 16  Temp: 98.4 F (36.9 C)  SpO2: 100%     FHT: Lab orders placed from triage: UA/UPT

## 2020-07-06 NOTE — MAU Provider Note (Addendum)
History     CSN: [id number]  Arrival date and time: 07/06/20 7035   First Provider Initiated Contact with Patient 07/06/20 0920      Chief Complaint  Patient presents with   Abdominal Pain   HPI  Patient is G4P2 at [redacted]w[redacted]d based on LMP who presents with abdominal pain. Patient states she had a positive at home pregnancy test on 06/30/2020. She started to experience abdominal pain and vaginal bleeding without clotting on 11/07/2021and presented to the [facility] ED. Patient had complete workup at [facility] ED showing positive UPT with hCG at 118. [place] did not show any intrauterine pregnancy. Patient was also diagnosed with chlamydia and trich. Patient treated for chlamydia with azithromycin in the ED but instructed that she could not be treated in the ED for trich and needs to see an OB outpatient. Patient continued to have abdominal pain but vaginal bleeding decreased to a pink tinged discharge. Patient also notes nausea and vomiting, so she presented to MAU for a second opinion. Patient notes she continues to have upper abdominal pain with blood tinged vaginal discharge. She states she has not taken any medication to help relieve the pain. She denies back pain, fever, chills, vomiting, or leg swelling today.   OB History     Gravida  4   Para  2   Term  2   Preterm      AB  1   Living  2      SAB  1   TAB      Ectopic      Multiple      Live Births  2           Past Medical History:  Diagnosis Date   Gastritis     Past Surgical History:  Procedure Laterality Date   NO PAST SURGERIES      Family History  Problem Relation Age of Onset   Lupus Mother    Diabetes Father     Social History   Tobacco Use   Smoking status: Never Smoker   Smokeless tobacco: Never Used  Vaping Use   Vaping Use: Never used  Substance Use Topics   Alcohol use: No   Drug use: Yes    Types: Marijuana    Comment: September 2019    Allergies: No Known  Allergies  Medications Prior to Admission  Medication Sig Dispense Refill Last Dose   metroNIDAZOLE (FLAGYL) 500 MG tablet Take 1 tablet (500 mg total) by mouth 2 (two) times daily. 14 tablet 0    ondansetron (ZOFRAN ODT) 4 MG disintegrating tablet Take 1 tablet (4 mg total) by mouth every 8 (eight) hours as needed for nausea or vomiting. 12 tablet 0    pantoprazole (PROTONIX) 40 MG tablet Take 1 tablet (40 mg total) by mouth daily. 30 tablet 0     Review of Systems  Constitutional: Negative for chills and fever.  Respiratory: Negative for shortness of breath.   Cardiovascular: Negative for chest pain and leg swelling.  Gastrointestinal: Positive for abdominal pain (Upper right and left quadrants), nausea and vomiting. Negative for constipation and diarrhea.  Genitourinary: Positive for vaginal bleeding and vaginal discharge. Negative for dysuria, flank pain and pelvic pain.  Musculoskeletal: Negative for back pain.  Neurological: Negative for dizziness and headaches.   Physical Exam   Blood pressure (!) 152/81, pulse 94, temperature 98.4 F (36.9 C), resp. rate 16, height 5\' 3"  (1.6 m), weight 101.2 kg, last menstrual period  05/30/2020, SpO2 100 %, unknown if currently breastfeeding.  Physical Exam Exam conducted with a chaperone present.  Constitutional:      Appearance: She is obese.  HENT:     Head: Normocephalic and atraumatic.  Pulmonary:     Effort: Pulmonary effort is normal.  Abdominal:     General: Abdomen is flat. There is no distension.     Palpations: Abdomen is soft.     Tenderness: There is abdominal tenderness (RUQ and LUQ). There is no guarding.  Genitourinary:    General: Normal vulva.     Vagina: No bleeding.     Cervix: Discharge, friability and erythema present. No cervical bleeding.     Uterus: Normal.      Adnexa: Right adnexa normal and left adnexa normal.       Right: No tenderness.         Left: No tenderness.    Musculoskeletal:        General:  No swelling.     Right lower leg: No edema.     Left lower leg: No edema.  Skin:    General: Skin is warm and dry.  Neurological:     General: No focal deficit present.     Mental Status: She is alert and oriented to person, place, and time.     MAU Course  Procedures Results for orders placed or performed during the hospital encounter of 07/06/20 (from the past 24 hour(s))  Urinalysis, Routine w reflex microscopic Urine, Clean Catch     Status: Abnormal   Collection Time: 07/06/20  8:29 AM  Result Value Ref Range   Color, Urine YELLOW YELLOW   APPearance HAZY (A) CLEAR   Specific Gravity, Urine 1.014 1.005 - 1.030   pH 6.0 5.0 - 8.0   Glucose, UA NEGATIVE NEGATIVE mg/dL   Hgb urine dipstick NEGATIVE NEGATIVE   Bilirubin Urine NEGATIVE NEGATIVE   Ketones, ur NEGATIVE NEGATIVE mg/dL   Protein, ur NEGATIVE NEGATIVE mg/dL   Nitrite NEGATIVE NEGATIVE   Leukocytes,Ua SMALL (A) NEGATIVE   RBC / HPF 0-5 0 - 5 RBC/hpf   WBC, UA 11-20 0 - 5 WBC/hpf   Bacteria, UA NONE SEEN NONE SEEN   Squamous Epithelial / LPF 0-5 0 - 5   Mucus PRESENT   hCG, quantitative, pregnancy     Status: Abnormal   Collection Time: 07/06/20  9:20 AM  Result Value Ref Range   hCG, Beta Chain, Quant, S 115 (H) <5 mIU/mL  Pregnancy, urine POC     Status: Abnormal   Collection Time: 07/06/20  9:25 AM  Result Value Ref Range   Preg Test, Ur POSITIVE (A) NEGATIVE   MDM Patient experiencing abdominal pain with vaginal discharge and previous bleeding. Patient diagnosed with Chlamydia and Trichomoniasis on 07/03/2020. Patient previously given Azithromycin for chlamydia but no treatment given for Trichomoniasis. [place] completed and showed no evidence of intrauterine pregnancy, UPT positive, and hCG levels 118 on 07/03/2020. UPT remains positive today with repeat hCG levels 115 today. Patient given Flagyl 2000mg  for Trichomoniasis today. Pelvic exam completed today showed mucous discharge from the cervix with no vaginal  bleeding. Bimanual exam revealed no cervical motion tenderness. Patient will need repeat hCG levels in 48 hours to examine for possible miscarriage vs. pregnancy.   Assessment and Plan  1. Abdominal pain with previous vaginal bleeding 2. Trichomoniasis  Discharge home with follow up in 48 hours in office for repeat hCG quant.  Patient educated about possible miscarriage vs.  pregnancy. Educated patient on further management and partner treatment for STI. Patient given follow up instructions if symptoms worsen.   Gigi Gin 07/06/2020, 11:11 AM    Attestation of Supervision of Student:  I confirm that I have verified the information documented in the physician assistant student's note and that I have also personally reperformed the history, physical exam and all medical decision making activities.  I have verified that all services and findings are accurately documented in this student's note; and I agree with management and plan as outlined in the documentation. I have also made any necessary editorial changes. I was present for the entire encounter.   Raelyn Mora, CNM [facility] [employer] 07/06/2020 1:59 PM

## 2020-07-06 NOTE — MAU Provider Note (Addendum)
History     CSN: [id number]  Arrival date and time: 07/06/20 9562   First Provider Initiated Contact with Patient 07/06/20 0920      Chief Complaint  Patient presents with  . Abdominal Pain   Ms. Kelly Barnes is a 29 y.o. year old G60P2012 female at [redacted]w[redacted]d weeks gestation who presents to MAU reporting pain and vaginal discharge, upper abdominal pain, nausea vomiting today.  She was seen at the [facility] emergency department on July 03, 2020 with a complaint of vaginal bleeding and upper abdominal pain.  She states that she was treated for chlamydia but was told that she needed to be treated by her OB for positive trichomonas.  Upon review of care everywhere records, her hCG level was 118 and the ultrasound showed no intrauterine pregnancy on July 03, 2020.   OB History    Gravida  4   Para  2   Term  2   Preterm      AB  1   Living  2     SAB  1   TAB      Ectopic      Multiple      Live Births  2           Past Medical History:  Diagnosis Date  . Gastritis     Past Surgical History:  Procedure Laterality Date  . NO PAST SURGERIES      Family History  Problem Relation Age of Onset  . Lupus Mother   . Diabetes Father     Social History   Tobacco Use  . Smoking status: Never Smoker  . Smokeless tobacco: Never Used  Vaping Use  . Vaping Use: Never used  Substance Use Topics  . Alcohol use: No  . Drug use: Yes    Types: Marijuana    Comment: September 2019    Allergies: No Known Allergies  No medications prior to admission.    Review of Systems  Constitutional: Negative.   HENT: Negative.   Eyes: Negative.   Respiratory: Negative.   Cardiovascular: Negative.   Gastrointestinal: Positive for abdominal pain (upper), nausea and vomiting.  Endocrine: Negative.   Genitourinary: Positive for vaginal discharge (pink).  Musculoskeletal: Negative.   Skin: Negative.   Allergic/Immunologic: Negative.   Neurological:  Negative.   Hematological: Negative.   Psychiatric/Behavioral: Negative.    Physical Exam   Patient Vitals for the past 24 hrs:  BP Temp Pulse Resp SpO2 Height Weight  07/06/20 1143 (!) 141/73 -- 75 16 -- -- --  07/06/20 0835 (!) 152/81 98.4 F (36.9 C) 94 16 100 % 5\' 3"  (1.6 m) 101.2 kg    Physical Exam Vitals and nursing note reviewed. Exam conducted with a chaperone present.  Constitutional:      Appearance: Normal appearance. She is obese.  HENT:     Head: Normocephalic and atraumatic.  Cardiovascular:     Rate and Rhythm: Normal rate.     Pulses: Normal pulses.  Pulmonary:     Effort: Pulmonary effort is normal.  Genitourinary:    Comments: Uterus: non-tender, SE: cervix is smooth, pink, no lesions, moderate amt of dark, red blood vaginal bleeding -- cervix: closed/long/firm, no CMT or friability, no adnexal tenderness  Musculoskeletal:        General: Normal range of motion.     Cervical back: Normal range of motion.  Skin:    General: Skin is warm and dry.  Neurological:  Mental Status: She is alert and oriented to person, place, and time.  Psychiatric:        Mood and Affect: Mood normal.        Behavior: Behavior normal.        Thought Content: Thought content normal.        Judgment: Judgment normal.    MAU Course  Procedures  MDM HCG Flagyl 2 grams for Trich -- (+) dx'd on 07/03/20 at [facility] ED no tx received per ED provider's note  Results for orders placed or performed during the hospital encounter of 07/06/20 (from the past 24 hour(s))  Urinalysis, Routine w reflex microscopic Urine, Clean Catch     Status: Abnormal   Collection Time: 07/06/20  8:29 AM  Result Value Ref Range   Color, Urine YELLOW YELLOW   APPearance HAZY (A) CLEAR   Specific Gravity, Urine 1.014 1.005 - 1.030   pH 6.0 5.0 - 8.0   Glucose, UA NEGATIVE NEGATIVE mg/dL   Hgb urine dipstick NEGATIVE NEGATIVE   Bilirubin Urine NEGATIVE NEGATIVE   Ketones, ur NEGATIVE NEGATIVE mg/dL    Protein, ur NEGATIVE NEGATIVE mg/dL   Nitrite NEGATIVE NEGATIVE   Leukocytes,Ua SMALL (A) NEGATIVE   RBC / HPF 0-5 0 - 5 RBC/hpf   WBC, UA 11-20 0 - 5 WBC/hpf   Bacteria, UA NONE SEEN NONE SEEN   Squamous Epithelial / LPF 0-5 0 - 5   Mucus PRESENT   hCG, quantitative, pregnancy     Status: Abnormal   Collection Time: 07/06/20  9:20 AM  Result Value Ref Range   hCG, Beta Chain, Quant, S 115 (H) <5 mIU/mL  Pregnancy, urine POC     Status: Abnormal   Collection Time: 07/06/20  9:25 AM  Result Value Ref Range   Preg Test, Ur POSITIVE (A) NEGATIVE    Assessment and Plan  Pregnancy of unknown anatomic location - Plan: Discharge patient - Repeat HCG in 48 hrs at [facility] - Information provided on threatened miscarriage and ectopic pregnancy  Trichomonas vaginitis - Information provided on trich and chlamydia (although she received tx on Sunday)  - Discussed the increased chances of cramping and other bad outcomes - Advised to refrain from unprotected SI with partner or anyone else  - Keep scheduled appt with [facility] on Friday 07/08/2020 - Patient verbalized an understanding of the plan of care and agrees.   Raelyn Mora, MSN, CNM 07/06/2020, 9:20 AM

## 2020-07-06 NOTE — Discharge Instructions (Signed)
Return to MAU:  If you have heavier bleeding that soaks through more that 2 pads per hour for an hour or more  If you bleed so much that you feel like you might pass out or you do pass out  If you have significant abdominal pain that is not improved with Tylenol 1000 mg every 6 hours as needed for pain  If you develop a fever > 100.5  Please refrain from unprotected sexual intercourse with your partner or anyone else until 07/20/2020 or later.

## 2020-07-08 ENCOUNTER — Other Ambulatory Visit: Payer: Self-pay

## 2020-07-08 ENCOUNTER — Other Ambulatory Visit (INDEPENDENT_AMBULATORY_CARE_PROVIDER_SITE_OTHER): Payer: Self-pay

## 2020-07-08 VITALS — BP 122/77 | HR 83 | Wt 224.0 lb

## 2020-07-08 DIAGNOSIS — O3680X0 Pregnancy with inconclusive fetal viability, not applicable or unspecified: Secondary | ICD-10-CM

## 2020-07-08 LAB — BETA HCG QUANT (REF LAB): hCG Quant: 33 m[IU]/mL

## 2020-07-08 NOTE — Progress Notes (Addendum)
SUBJECTIVE 29 y.o. OB presents for STAT HCG.  C/o pain/fatigue 3-4/10, brown discharge.   PLAN STAT HCG sent to Lab. Will call patient with results.   07/11/20  TC to patient, notified her of HCG scheduled for 07/15/20 @ 8:00 am.  She verbalized understanding and agreement.

## 2020-07-08 NOTE — Progress Notes (Signed)
I reviewed the nurses note and agree with the plan of care.   Ashok Sawaya A, MD 11/22/2017 10:46 AM  

## 2020-07-08 NOTE — Progress Notes (Signed)
Called patient to give results of quantitative beta-Hcg.  No answer.  Left message to call office on Monday.  Brock Bad, MD 07/08/2020 12:57 PM

## 2020-07-15 ENCOUNTER — Other Ambulatory Visit: Payer: Self-pay

## 2020-07-15 DIAGNOSIS — O3680X0 Pregnancy with inconclusive fetal viability, not applicable or unspecified: Secondary | ICD-10-CM

## 2020-07-16 LAB — BETA HCG QUANT (REF LAB): hCG Quant: <1 m[IU]/mL

## 2021-07-20 ENCOUNTER — Encounter (HOSPITAL_BASED_OUTPATIENT_CLINIC_OR_DEPARTMENT_OTHER): Payer: Self-pay | Admitting: *Deleted

## 2021-07-20 ENCOUNTER — Emergency Department (HOSPITAL_BASED_OUTPATIENT_CLINIC_OR_DEPARTMENT_OTHER)
Admission: EM | Admit: 2021-07-20 | Discharge: 2021-07-20 | Disposition: A | Discharge status: Home / Self Care | Payer: No Typology Code available for payment source | Attending: Emergency Medicine | Admitting: Emergency Medicine

## 2021-07-20 ENCOUNTER — Other Ambulatory Visit: Payer: Self-pay

## 2021-07-20 DIAGNOSIS — N939 Abnormal uterine and vaginal bleeding, unspecified: Secondary | ICD-10-CM

## 2021-07-20 DIAGNOSIS — N898 Other specified noninflammatory disorders of vagina: Secondary | ICD-10-CM | POA: Insufficient documentation

## 2021-07-20 LAB — URINALYSIS, ROUTINE W REFLEX MICROSCOPIC
Bilirubin Urine: NEGATIVE
Glucose, UA: NEGATIVE mg/dL
Ketones, ur: NEGATIVE mg/dL
Leukocytes,Ua: NEGATIVE
Nitrite: NEGATIVE
Protein, ur: NEGATIVE mg/dL
Specific Gravity, Urine: 1.015 (ref 1.005–1.030)
pH: 7 (ref 5.0–8.0)

## 2021-07-20 LAB — WET PREP, GENITAL
Clue Cells Wet Prep HPF POC: NONE SEEN
Sperm: NONE SEEN
Trich, Wet Prep: NONE SEEN
WBC, Wet Prep HPF POC: <10 (ref <10)
Yeast Wet Prep HPF POC: NONE SEEN

## 2021-07-20 LAB — URINALYSIS, MICROSCOPIC (REFLEX)

## 2021-07-20 LAB — PREGNANCY, URINE: Preg Test, Ur: NEGATIVE

## 2021-07-20 NOTE — ED Provider Notes (Signed)
[facility] EMERGENCY DEPARTMENT Provider Note   CSN: [id number] Arrival date & time: 07/20/21  1933     History Chief Complaint  Patient presents with   Vaginal Bleeding    Kelly Barnes is a 30 y.o. female.  HPI  30 year old female presents to the emergency department with brown vaginal spotting.  Patient states has been going on for the past day.  She had 1 episode of brighter red spotting.  Last menstrual period was about 3 weeks ago.  She states that she had a pregnancy test at home that she thought had a faint positive line.  History of miscarriage about a year ago.  Denies any lower abdominal pain or cramping.  No other vaginal discharge.  No dysuria.  No fever.  Past Medical History:  Diagnosis Date   Gastritis     There are no problems to display for this patient.   Past Surgical History:  Procedure Laterality Date   NO PAST SURGERIES       OB History     Gravida  4   Para  2   Term  2   Preterm      AB  1   Living  2      SAB  1   IAB      Ectopic      Multiple      Live Births  2           Family History  Problem Relation Age of Onset   Lupus Mother    Diabetes Father     Social History   Tobacco Use   Smoking status: Never   Smokeless tobacco: Never  Vaping Use   Vaping Use: Never used  Substance Use Topics   Alcohol use: No   Drug use: Not Currently    Types: Marijuana    Comment: September 2019    Home Medications Prior to Admission medications   Not on File    Allergies    Patient has no known allergies.  Review of Systems   Review of Systems  Constitutional:  Negative for chills and fever.  HENT:  Negative for congestion.   Respiratory:  Negative for shortness of breath.   Cardiovascular:  Negative for chest pain.  Gastrointestinal:  Negative for abdominal pain, diarrhea and vomiting.  Genitourinary:  Positive for flank pain, pelvic pain and vaginal bleeding. Negative for dysuria and  vaginal discharge.  Skin:  Negative for rash.  Neurological:  Negative for headaches.   Physical Exam Updated Vital Signs BP (!) 123/102 (BP Location: Right Arm)   Pulse 95   Temp 98.6 F (37 C) (Oral)   Resp 16   Ht 5\' 3"  (1.6 m)   Wt 101.6 kg   LMP 07/07/2021   SpO2 100%   BMI 39.68 kg/m   Physical Exam Vitals and nursing note reviewed.  Constitutional:      Appearance: Normal appearance.  HENT:     Head: Normocephalic.     Mouth/Throat:     Mouth: Mucous membranes are moist.  Cardiovascular:     Rate and Rhythm: Normal rate.  Pulmonary:     Effort: Pulmonary effort is normal. No respiratory distress.  Abdominal:     Palpations: Abdomen is soft.     Tenderness: There is no abdominal tenderness.  Genitourinary:    General: Normal vulva.     Comments: Chaperoned by 13/06/2021.  Scant brown vaginal discharge in the vaginal vault.  No  odor.  Genitalia otherwise anatomically normal. Skin:    General: Skin is warm.  Neurological:     Mental Status: She is alert and oriented to person, place, and time. Mental status is at baseline.  Psychiatric:        Mood and Affect: Mood normal.    ED Results / Procedures / Treatments   Labs (all labs ordered are listed, but only abnormal results are displayed) Labs Reviewed  URINALYSIS, ROUTINE W REFLEX MICROSCOPIC - Abnormal; Notable for the following components:      Result Value   Hgb urine dipstick SMALL (*)    All other components within normal limits  URINALYSIS, MICROSCOPIC (REFLEX) - Abnormal; Notable for the following components:   Bacteria, UA RARE (*)    All other components within normal limits  WET PREP, GENITAL  PREGNANCY, URINE  GC/CHLAMYDIA PROBE AMP (Greasy) NOT AT [facility]    EKG None  Radiology No results found.  Procedures Procedures   Medications Ordered in ED Medications - No data to display  ED Course  I have reviewed the triage vital signs and the nursing notes.  Pertinent labs &  imaging results that were available during my care of the patient were reviewed by me and considered in my medical decision making (see chart for details).    MDM Rules/Calculators/A&P                           30 year old female presents emergency department with concern for brown spotting.  States her last menstrual period was 3 weeks ago.  Took a home pregnancy test and feels like there was a faint line in the positive suction.  No abdominal pain or cramping.  Vitals are stable on arrival.  Pregnancy test here is negative, urinalysis shows no UTI.  Pelvic shows scant brown discharge in the vaginal vault, no odor.  Wet prep is negative.  Will send for GC chlamydia.  It is possible that patient is early pregnant, less than 4 weeks.  It is possible that this is an early miscarriage.  Pregnancy would be too early to identify anything on an ultrasound.  Low suspicion for ectopic at this time.  Could also be normal spotting in early pregnancy.  Plan to follow-up with her OB/GYN tomorrow for reevaluation, blood work and ultrasound as needed.  Patient at this time appears safe and stable for discharge and will be treated as an outpatient.  Discharge plan and strict return to ED precautions discussed, patient verbalizes understanding and agreement.  Final Clinical Impression(s) / ED Diagnoses Final diagnoses:  None    Rx / DC Orders ED Discharge Orders     None        Rozelle Logan, DO 07/20/21 2213

## 2021-07-20 NOTE — Discharge Instructions (Signed)
You have been seen and discharged from the emergency department.  Your pregnancy test here was negative, urinalysis showed no infection.  Pelvic swabs showed no signs of infection.  It is possible that you have very early pregnant and not detected on her testing.  It is possible that this is normal bleeding in the first trimester of pregnancy.  This could be vaginal spotting unrelated to pregnancy.  It is important that you follow-up with your OB/GYN fTake home medications as prescribed. If you have any worsening symptoms or further concerns for your health please return to an emergency department for further evaluation.

## 2021-07-20 NOTE — ED Triage Notes (Signed)
Brown vaginal discharge x 3 days. Tonight the discharge became bright red. She has had a positive home pregnancy test. She thinks she  is [redacted] weeks pregnant.

## 2021-07-21 LAB — GC/CHLAMYDIA PROBE AMP (~~LOC~~) NOT AT ARMC
Chlamydia: NEGATIVE
Comment: NEGATIVE
Comment: NORMAL
Neisseria Gonorrhea: NEGATIVE

## 2022-01-23 ENCOUNTER — Emergency Department (HOSPITAL_BASED_OUTPATIENT_CLINIC_OR_DEPARTMENT_OTHER): Payer: BC Managed Care – PPO

## 2022-01-23 ENCOUNTER — Encounter (HOSPITAL_BASED_OUTPATIENT_CLINIC_OR_DEPARTMENT_OTHER): Payer: Self-pay

## 2022-01-23 ENCOUNTER — Other Ambulatory Visit (HOSPITAL_BASED_OUTPATIENT_CLINIC_OR_DEPARTMENT_OTHER): Payer: Self-pay

## 2022-01-23 ENCOUNTER — Emergency Department (HOSPITAL_BASED_OUTPATIENT_CLINIC_OR_DEPARTMENT_OTHER)
Admission: EM | Admit: 2022-01-23 | Discharge: 2022-01-23 | Disposition: A | Discharge status: Home / Self Care | Payer: BC Managed Care – PPO | Attending: Emergency Medicine | Admitting: Emergency Medicine

## 2022-01-23 DIAGNOSIS — N898 Other specified noninflammatory disorders of vagina: Secondary | ICD-10-CM | POA: Insufficient documentation

## 2022-01-23 DIAGNOSIS — O2341 Unspecified infection of urinary tract in pregnancy, first trimester: Secondary | ICD-10-CM | POA: Diagnosis not present

## 2022-01-23 DIAGNOSIS — O4690 Antepartum hemorrhage, unspecified, unspecified trimester: Secondary | ICD-10-CM

## 2022-01-23 DIAGNOSIS — Z3A01 Less than 8 weeks gestation of pregnancy: Secondary | ICD-10-CM | POA: Insufficient documentation

## 2022-01-23 DIAGNOSIS — N9489 Other specified conditions associated with female genital organs and menstrual cycle: Secondary | ICD-10-CM | POA: Diagnosis not present

## 2022-01-23 DIAGNOSIS — O26891 Other specified pregnancy related conditions, first trimester: Secondary | ICD-10-CM | POA: Diagnosis not present

## 2022-01-23 DIAGNOSIS — O26851 Spotting complicating pregnancy, first trimester: Secondary | ICD-10-CM | POA: Insufficient documentation

## 2022-01-23 DIAGNOSIS — N390 Urinary tract infection, site not specified: Secondary | ICD-10-CM

## 2022-01-23 LAB — URINALYSIS, ROUTINE W REFLEX MICROSCOPIC
Bilirubin Urine: NEGATIVE
Glucose, UA: NEGATIVE mg/dL
Ketones, ur: NEGATIVE mg/dL
Nitrite: NEGATIVE
Protein, ur: 100 mg/dL — AB
Specific Gravity, Urine: >=1.03 (ref 1.005–1.030)
pH: 5.5 (ref 5.0–8.0)

## 2022-01-23 LAB — COMPREHENSIVE METABOLIC PANEL
ALT: 21 U/L (ref 0–44)
AST: 19 U/L (ref 15–41)
Albumin: 3.8 g/dL (ref 3.5–5.0)
Alkaline Phosphatase: 67 U/L (ref 38–126)
Anion gap: 9 (ref 5–15)
BUN: 12 mg/dL (ref 6–20)
CO2: 22 mmol/L (ref 22–32)
Calcium: 8.9 mg/dL (ref 8.9–10.3)
Chloride: 109 mmol/L (ref 98–111)
Creatinine, Ser: 0.67 mg/dL (ref 0.44–1.00)
GFR, Estimated: >60 mL/min (ref >60)
Glucose, Bld: 112 mg/dL — ABNORMAL HIGH (ref 70–99)
Potassium: 3.7 mmol/L (ref 3.5–5.1)
Sodium: 140 mmol/L (ref 135–145)
Total Bilirubin: 0.4 mg/dL (ref 0.3–1.2)
Total Protein: 7.8 g/dL (ref 6.5–8.1)

## 2022-01-23 LAB — CBC WITH DIFFERENTIAL/PLATELET
Abs Immature Granulocytes: 0.04 10*3/uL (ref 0.00–0.07)
Basophils Absolute: 0 10*3/uL (ref 0.0–0.1)
Basophils Relative: 0 %
Eosinophils Absolute: 0.3 10*3/uL (ref 0.0–0.5)
Eosinophils Relative: 2 %
HCT: 41.6 % (ref 36.0–46.0)
Hemoglobin: 14.2 g/dL (ref 12.0–15.0)
Immature Granulocytes: 0 %
Lymphocytes Relative: 21 %
Lymphs Abs: 2.4 10*3/uL (ref 0.7–4.0)
MCH: 30.6 pg (ref 26.0–34.0)
MCHC: 34.1 g/dL (ref 30.0–36.0)
MCV: 89.7 fL (ref 80.0–100.0)
Monocytes Absolute: 0.7 10*3/uL (ref 0.1–1.0)
Monocytes Relative: 6 %
Neutro Abs: 8.4 10*3/uL — ABNORMAL HIGH (ref 1.7–7.7)
Neutrophils Relative %: 71 %
Platelets: 420 10*3/uL — ABNORMAL HIGH (ref 150–400)
RBC: 4.64 MIL/uL (ref 3.87–5.11)
RDW: 12.2 % (ref 11.5–15.5)
WBC: 11.9 10*3/uL — ABNORMAL HIGH (ref 4.0–10.5)
nRBC: 0 % (ref 0.0–0.2)

## 2022-01-23 LAB — PREGNANCY, URINE: Preg Test, Ur: POSITIVE — AB

## 2022-01-23 LAB — URINALYSIS, MICROSCOPIC (REFLEX)
RBC / HPF: >50 RBC/hpf (ref 0–5)
WBC, UA: >50 WBC/hpf (ref 0–5)

## 2022-01-23 LAB — LIPASE, BLOOD: Lipase: 26 U/L (ref 11–51)

## 2022-01-23 LAB — HCG, QUANTITATIVE, PREGNANCY: hCG, Beta Chain, Quant, S: 6100 m[IU]/mL — ABNORMAL HIGH (ref <5)

## 2022-01-23 MED ORDER — CEFTRIAXONE SODIUM 1 G IJ SOLR
1.0000 g | Freq: Once | INTRAMUSCULAR | Status: AC
  Administered 2022-01-23: 1 g via INTRAVENOUS
  Filled 2022-01-23: qty 10

## 2022-01-23 MED ORDER — PRENATAL 27-0.8 MG PO TABS
ORAL_TABLET | Freq: Every day | ORAL | 0 refills | Status: AC
  Filled 2022-01-23: qty 100, 100d supply, fill #0
  Filled 2022-01-23: qty 30, 30d supply, fill #0

## 2022-01-23 MED ORDER — CEFADROXIL 500 MG PO CAPS
500.0000 mg | ORAL_CAPSULE | Freq: Two times a day (BID) | ORAL | 0 refills | Status: AC
  Filled 2022-01-23: qty 14, 7d supply, fill #0

## 2022-01-23 MED ORDER — SODIUM CHLORIDE 0.9 % IV BOLUS
1000.0000 mL | Freq: Once | INTRAVENOUS | Status: AC
  Administered 2022-01-23: 1000 mL via INTRAVENOUS

## 2022-01-23 NOTE — ED Notes (Signed)
US at bedside

## 2022-01-23 NOTE — Discharge Instructions (Addendum)
Please follow-up with your OB/GYN for repeat beta hCG testing in the next 2 to 3 days.  Also follow-up for repeat ultrasound in the next 10 to 14 days with your OB/GYN.  It was a pleasure caring for you today in the emergency department.  Please return to the emergency department for any worsening or worrisome symptoms.

## 2022-01-23 NOTE — ED Notes (Signed)
Notified lab of urine culture order 

## 2022-01-23 NOTE — ED Provider Notes (Signed)
[facility] EMERGENCY DEPARTMENT Provider Note   CSN: [id number] Arrival date & time: 01/23/22  0720     History  Chief Complaint  Patient presents with   Vaginal Bleeding    Kelly Barnes is a 31 y.o. female.  Patient as above with significant medical history as below, including G5, P2 who presents to the ED with complaint of slight abdominal cramping, vaginal discharge,?  Bleeding, concern for UTI.  Patient reports that she took home pregnancy test last week and repeated few days later both are positive.  She has appoint with OB/GYN next week.  This morning when she got up from bed she went to use the restroom noticed some streaks of blood in her urine, some discharge.  Some slight discomfort to her suprapubic region.  She has since use the restroom again and has not noticed any further bleeding.  No rush of fluids, no contractions, no vomiting, no rashes.  No recent travel or sick contacts     Past Medical History:  Diagnosis Date   Gastritis     Past Surgical History:  Procedure Laterality Date   NO PAST SURGERIES       The history is provided by the patient and the spouse. No language interpreter was used.  Vaginal Bleeding Associated symptoms: vaginal discharge   Associated symptoms: no abdominal pain, no fever and no nausea       Home Medications Prior to Admission medications   Medication Sig Start Date End Date Taking? Authorizing Provider  cefadroxil (DURICEF) 500 MG capsule Take 1 capsule (500 mg total) by mouth 2 (two) times daily for 7 days. 01/23/22 01/30/22 Yes Sloan Leiter, DO  Prenatal Vit-Fe Fumarate-FA (PRENATAL COMPLETE) 14-0.4 MG TABS Take 1 each by mouth daily. 01/23/22 02/22/22 Yes Sloan Leiter, DO      Allergies    Patient has no known allergies.    Review of Systems   Review of Systems  Constitutional:  Negative for chills and fever.  HENT:  Negative for facial swelling and trouble swallowing.   Eyes:  Negative for photophobia  and visual disturbance.  Respiratory:  Negative for cough and shortness of breath.   Cardiovascular:  Negative for chest pain and palpitations.  Gastrointestinal:  Negative for abdominal pain, nausea and vomiting.  Endocrine: Negative for polydipsia and polyuria.  Genitourinary:  Positive for pelvic pain, vaginal bleeding and vaginal discharge. Negative for difficulty urinating and hematuria.  Musculoskeletal:  Negative for gait problem and joint swelling.  Skin:  Negative for pallor and rash.  Neurological:  Negative for syncope and headaches.  Psychiatric/Behavioral:  Negative for agitation and confusion.    Physical Exam Updated Vital Signs BP (!) 151/66   Pulse 95   Temp 98 F (36.7 C)   Resp 18   Ht 5\' 4"  (1.626 m)   Wt 90.7 kg   LMP 12/16/2021   SpO2 100%   BMI 34.33 kg/m  Physical Exam Vitals and nursing note reviewed.  Constitutional:      General: She is not in acute distress.    Appearance: Normal appearance.  HENT:     Head: Normocephalic and atraumatic.     Right Ear: External ear normal.     Left Ear: External ear normal.     Nose: Nose normal.     Mouth/Throat:     Mouth: Mucous membranes are moist.  Eyes:     General: No scleral icterus.       Right eye: No  discharge.        Left eye: No discharge.  Cardiovascular:     Rate and Rhythm: Normal rate and regular rhythm.     Pulses: Normal pulses.     Heart sounds: Normal heart sounds.  Pulmonary:     Effort: Pulmonary effort is normal. No respiratory distress.     Breath sounds: Normal breath sounds.  Abdominal:     General: Abdomen is flat.     Palpations: Abdomen is soft.     Tenderness: There is no abdominal tenderness.  Musculoskeletal:        General: Normal range of motion.     Cervical back: Normal range of motion.     Right lower leg: No edema.     Left lower leg: No edema.  Skin:    General: Skin is warm and dry.     Capillary Refill: Capillary refill takes less than 2 seconds.   Neurological:     Mental Status: She is alert.  Psychiatric:        Mood and Affect: Mood normal.        Behavior: Behavior normal.    ED Results / Procedures / Treatments   Labs (all labs ordered are listed, but only abnormal results are displayed) Labs Reviewed  URINALYSIS, ROUTINE W REFLEX MICROSCOPIC - Abnormal; Notable for the following components:      Result Value   APPearance CLOUDY (*)    Hgb urine dipstick LARGE (*)    Protein, ur 100 (*)    Leukocytes,Ua LARGE (*)    All other components within normal limits  PREGNANCY, URINE - Abnormal; Notable for the following components:   Preg Test, Ur POSITIVE (*)    All other components within normal limits  CBC WITH DIFFERENTIAL/PLATELET - Abnormal; Notable for the following components:   WBC 11.9 (*)    Platelets 420 (*)    Neutro Abs 8.4 (*)    All other components within normal limits  COMPREHENSIVE METABOLIC PANEL - Abnormal; Notable for the following components:   Glucose, Bld 112 (*)    All other components within normal limits  HCG, QUANTITATIVE, PREGNANCY - Abnormal; Notable for the following components:   hCG, Beta Chain, Quant, S 6,100 (*)    All other components within normal limits  URINALYSIS, MICROSCOPIC (REFLEX) - Abnormal; Notable for the following components:   Bacteria, UA MANY (*)    All other components within normal limits  URINE CULTURE  LIPASE, BLOOD    EKG None  Radiology US OB LESS THAN 14 WEEKS WITH OB TRANSVAGINAL  Result Date: 01/23/2022 CLINICAL DATA:  Vaginal bleeding EXAM: OBSTETRIC <14 WK US AND TRANSVAGINAL OB US TECHNIQUE: Both transabdominal and transvaginal ultrasound examinations were performed for complete evaluation of the gestation as well as the maternal uterus, adnexal regions, and pelvic cul-de-sac. Transvaginal technique was performed to assess early pregnancy. COMPARISON:  Prior obstetric ultrasound most recently 07/03/2020 FINDINGS: Intrauterine gestational sac: An  intrauterine gestational sac is identified on the transabdominal images but not on the transvaginal images due to attenuation. Yolk sac:  Not Visualized. Embryo:  Not Visualized. Cardiac Activity: Not Visualized. MSD: 6 mm   5 w   2  d Subchorionic hemorrhage:  None visualized. Maternal uterus/adnexae: The right ovary is unremarkable measuring 2.7 cm x 2.6 cm x 1.8 cm. The left ovary is unremarkable measuring 3.1 cm x 2.2 cm x 2.5 cm. There is no free fluid in the pelvis Other: There is debris in the  bladder. IMPRESSION: 1. Early intrauterine gestational sac identified on the transabdominal images with an estimated gestational age of [redacted] weeks 2 days. No definite yolk sac or embryo are identified. Recommend follow-up quantitative B-HCG levels and follow-up US in 14 days to assess viability. 2. Debris in the bladder.  Correlate with urinalysis. Electronically Signed   By: Lesia Hausen M.D.   On: 01/23/2022 09:37    Procedures Procedures    Medications Ordered in ED Medications  cefTRIAXone (ROCEPHIN) 1 g in sodium chloride 0.9 % 100 mL IVPB (0 g Intravenous Stopped 01/23/22 0905)  sodium chloride 0.9 % bolus 1,000 mL (0 mLs Intravenous Stopped 01/23/22 0905)    ED Course/ Medical Decision Making/ A&P Clinical Course as of 01/23/22 1051  Tue Jan 23, 2022  0944 IMPRESSION: 1. Early intrauterine gestational sac identified on the transabdominal images with an estimated gestational age of [redacted] weeks 2 days. No definite yolk sac or embryo are identified. Recommend follow-up quantitative B-HCG levels and follow-up US in 14 days to assess viability. 2. Debris in the bladder.  Correlate with urinalysis.   [SG]    Clinical Course User Index [SG] Sloan Leiter, DO                           Medical Decision Making Amount and/or Complexity of Data Reviewed Labs: ordered. Radiology: ordered.  Risk OTC drugs. Prescription drug management.    CC: Vaginal bleeding in pregnancy  This patient  presents to the Emergency Department for the above complaint. This involves an extensive number of treatment options and is a complaint that carries with it a high risk of complications and morbidity. Vital signs were reviewed. Serious etiologies considered.  Differential includes but is not exclusive to pregnancy related causes of vaginal bleeding such as threatened miscarriage, incomplete miscarriage, normal bleeding from an early trimester pregnancy, ectopic pregnancy, still birth, blighted ovum, etc.   Record review:  Previous records obtained and reviewed  Prior ED visits, prior labs and imaging,  blood type B+ per chart review  Additional history obtained from spouse  Medical and surgical history as noted above.   Work up as above, notable for:  Labs & imaging results that were available during my care of the patient were visualized by me and considered in my medical decision making.   I ordered imaging studies which included TVUS OB. I visualized the imaging, interpreted images, and I agree with radiologist interpretation. Early IU gest sac, around 5wks 2 days. No definite yolk sac or embryo  Labs reviewed, she has UTI, sent culture. Mild leukocytosis at 11.9. not septic. Renal function is stable. No suspicion for pyelo.   Management: Rocephin  Reassessment:  Pt feeling better, tolerating PO, ambulatory w/ steady gait.  stable for dc  Admission was considered.   She has obgyn appointment in the next few days.  Follow-up with OB/GYN for repeat beta-hCG in 2-3 days and also rpt [place] in the next 10-14 days to be arranged with her OBGYN to assess viability. Strict return precautions were discussed.   Clinically the patient looks well, no indications for transfusion and no signs or symptoms of peritonitis. Because the patient was having a pregnancy related complaint their RH status was determined to be neg. No rhogam needed. Additional labs demonstrated UTI.     After careful  consideration I believe the patient is safe to be discharged home with outpatient OB/GYN followup. Patient will require repeat beta  HCG in 48 -72 hr and has been instructed to return to her OBGYN clinic. I reviewed the ultrasound result with the patient and reviewed ectopic precautions as a definitive IUP can not be determined with the ultrasound today. She should not be left alone so she can be monitored as she is at risk of ruptured ectopic and sudden death. Instructed to follow up  with her OB/GYN in 2 -3 days for repeat blood testing. The patient has been instructed to return immediately if the symptoms worsen in any way for re-evaluation. Patient verbalized understanding and is in agreement with current care plan. All questions answered prior to discharge.           Social determinants of health include -  Social History   Socioeconomic History   Marital status: Single    Spouse name: Not on file   Number of children: Not on file   Years of education: Not on file   Highest education level: Not on file  Occupational History   Not on file  Tobacco Use   Smoking status: Never   Smokeless tobacco: Never  Vaping Use   Vaping Use: Never used  Substance and Sexual Activity   Alcohol use: No   Drug use: Not Currently    Types: Marijuana    Comment: September 2019   Sexual activity: Yes  Other Topics Concern   Not on file  Social History Narrative   Not on file   Social Determinants of Health   Financial Resource Strain: Not on file  Food Insecurity: Not on file  Transportation Needs: Not on file  Physical Activity: Not on file  Stress: Not on file  Social Connections: Not on file  Intimate Partner Violence: Not on file      This chart was dictated using voice recognition software.  Despite best efforts to proofread,  errors can occur which can change the documentation meaning.         Final Clinical Impression(s) / ED Diagnoses Final diagnoses:  Urinary  tract infection with hematuria, site unspecified  Vaginal bleeding in pregnancy    Rx / DC Orders ED Discharge Orders          Ordered    cefadroxil (DURICEF) 500 MG capsule  2 times daily        01/23/22 1044    Prenatal Vit-Fe Fumarate-FA (PRENATAL COMPLETE) 14-0.4 MG TABS  Daily        01/23/22 1044              Tanda Rockers A, DO 01/23/22 1051

## 2022-01-23 NOTE — ED Notes (Signed)
Discharge instructions and follow up reviwed with patient. States understanding. Pt is ambulatory and discharged to home

## 2022-01-23 NOTE — ED Triage Notes (Addendum)
Pt reports she has light vaginal bleeding and watery discharge . Pt is pregnant LMP 12/16/21. Has appt with new provider at [facility] next week . Feels like she may have UTI. [id number]

## 2022-01-24 LAB — URINE CULTURE: Culture: 10000 — AB

## 2022-04-18 ENCOUNTER — Encounter: Payer: No Typology Code available for payment source | Admitting: Obstetrics & Gynecology

## 2022-08-18 IMAGING — US US OB < 14 WEEKS - US OB TV
1 series · 13 of 28 positions shown · non-contrast
Comparison: Prior obstetric ultrasound most recently 07/03/2020

CLINICAL DATA: Vaginal bleeding

EXAM:
OBSTETRIC <14 WK US AND TRANSVAGINAL OB US
TECHNIQUE: Both transabdominal and transvaginal ultrasound examinations were
performed for complete evaluation of the gestation as well as the
maternal uterus, adnexal regions, and pelvic cul-de-sac.
Transvaginal technique was performed to assess early pregnancy.

[Series 1: us ob < 14 weeks - us ob tv · 13 of 86 slices shown]
[im 4/86]
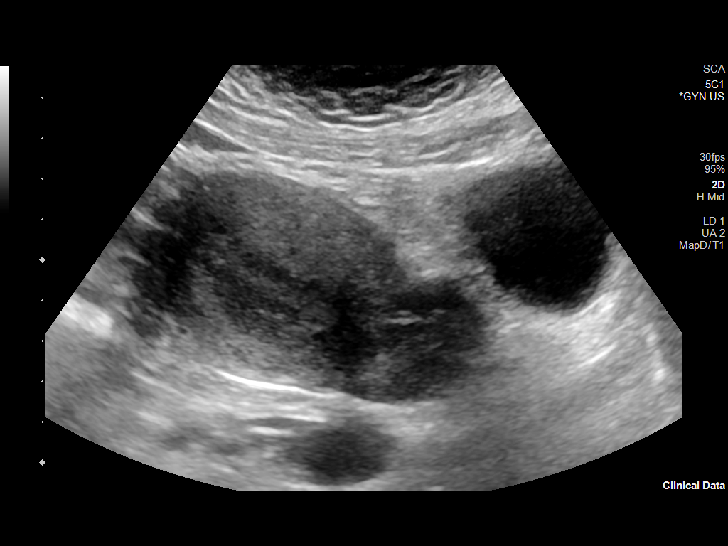
[im 10/86]
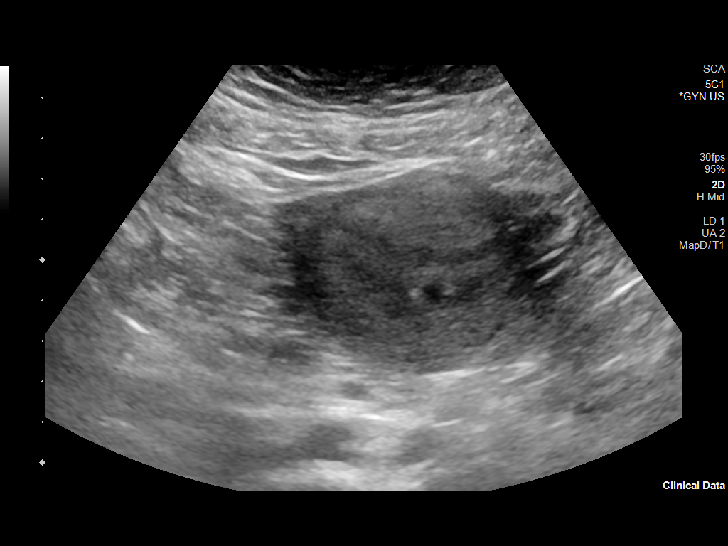
[im 16/86]
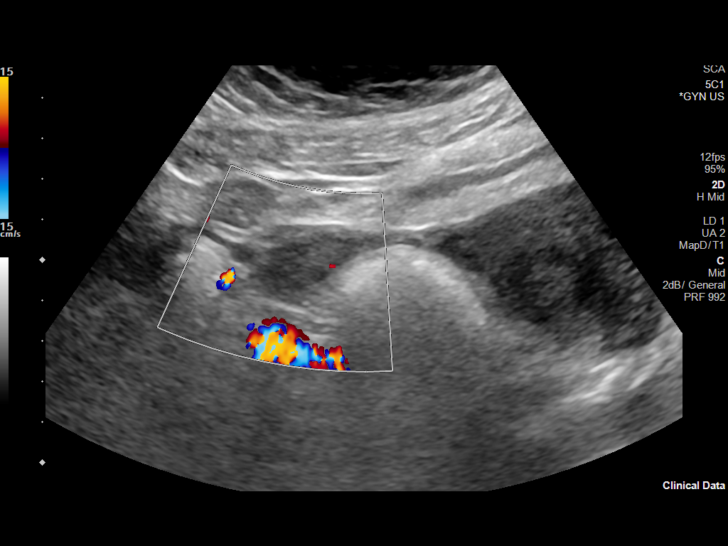
[im 23/86]
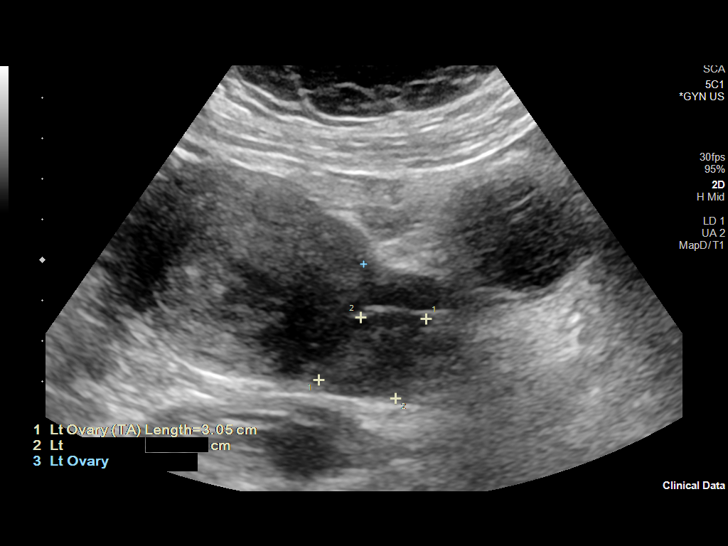
[im 29/86]
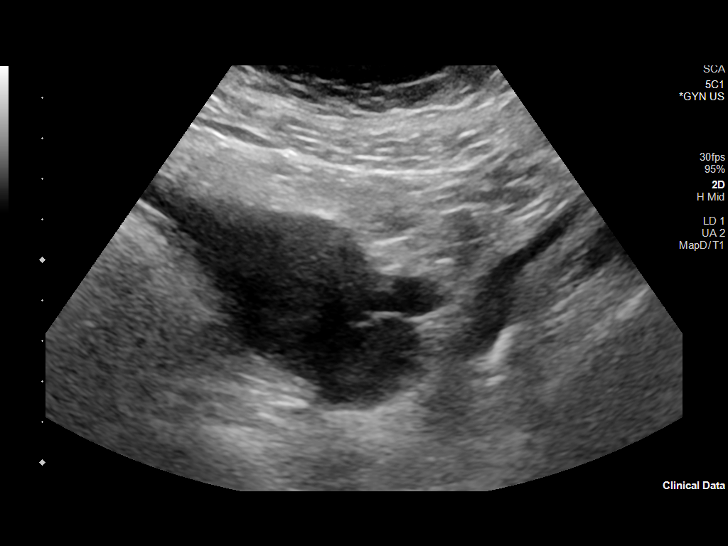
[im 35/86]
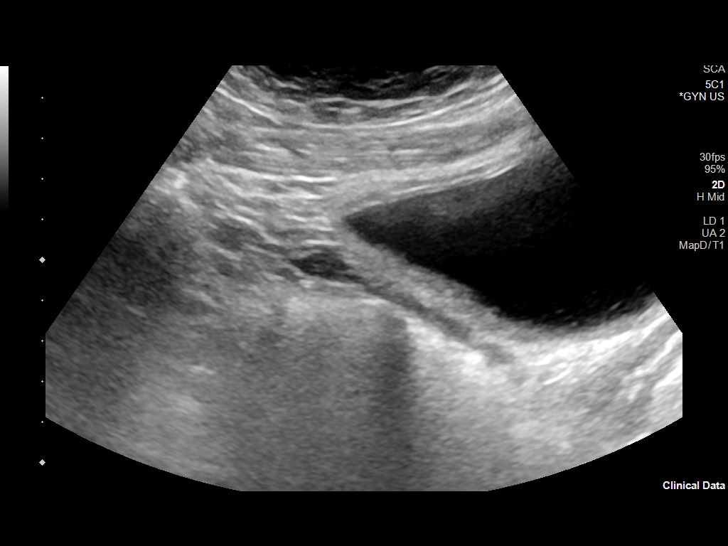
[im 45/86]
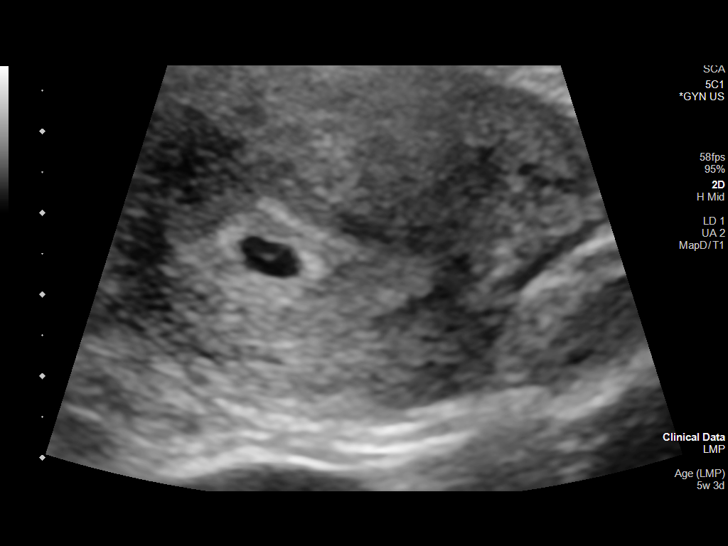
[im 51/86]
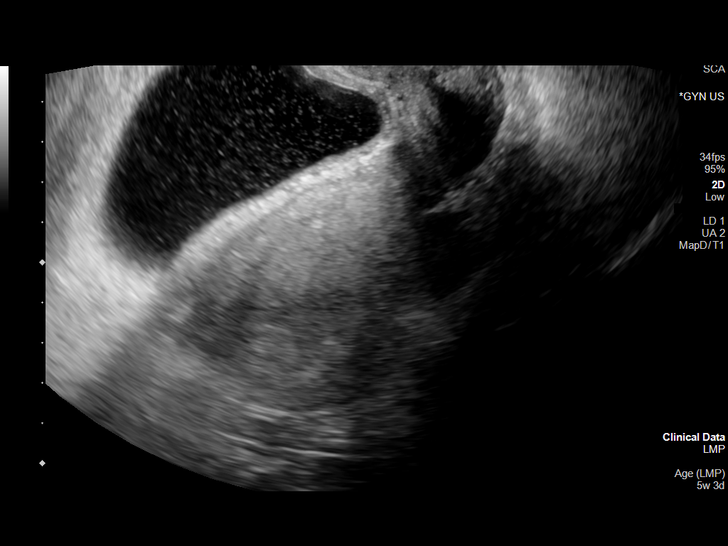
[im 57/86]
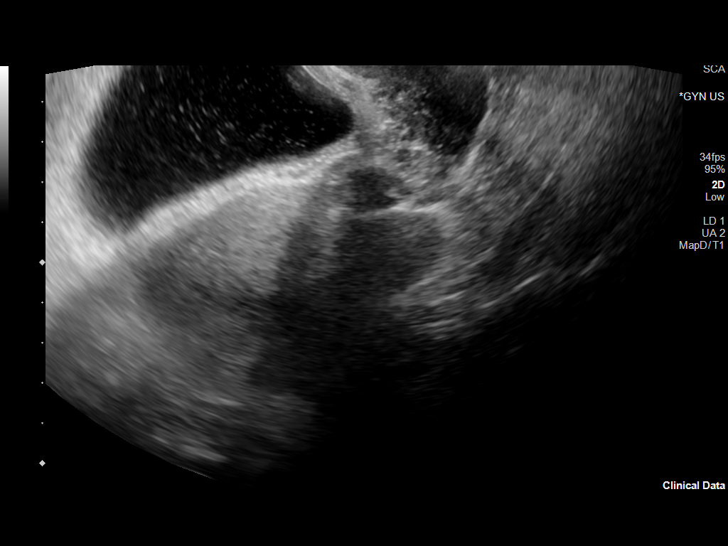
[im 63/86]
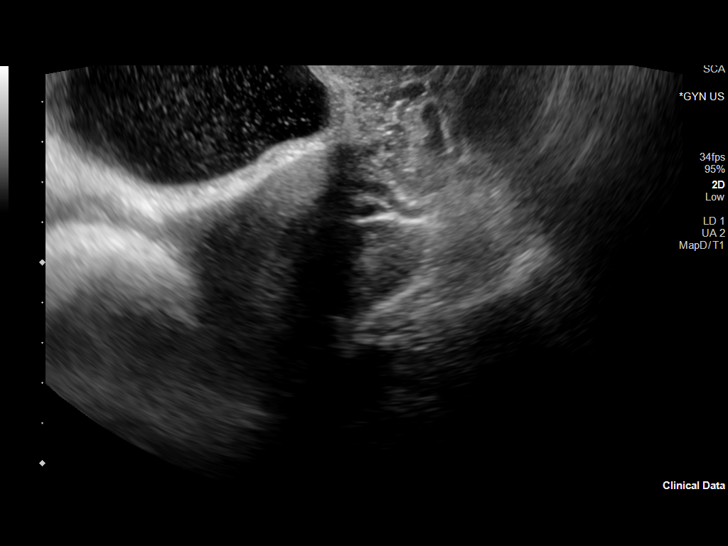
[im 70/86]
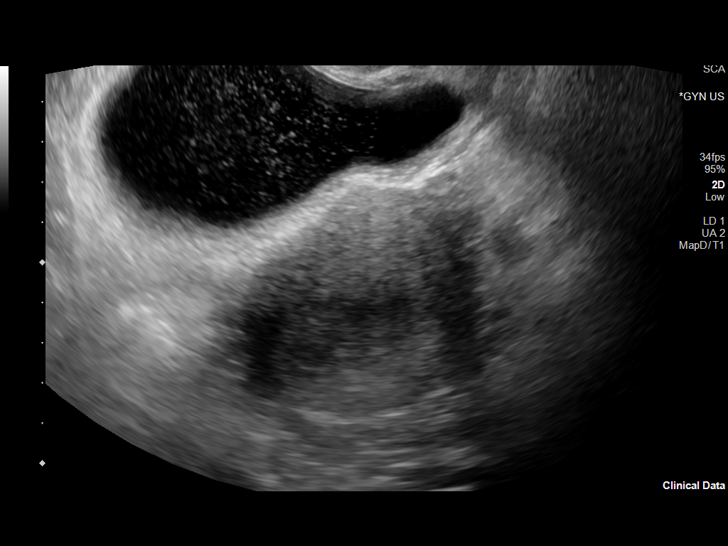
[im 76/86]
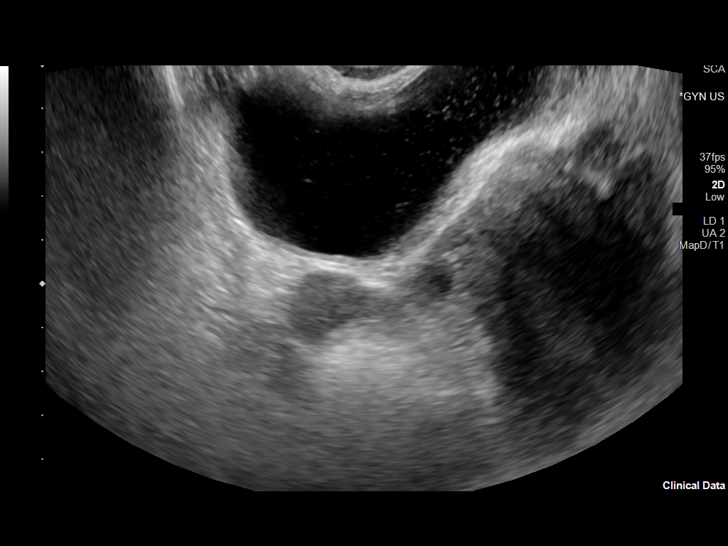
[im 82/86]
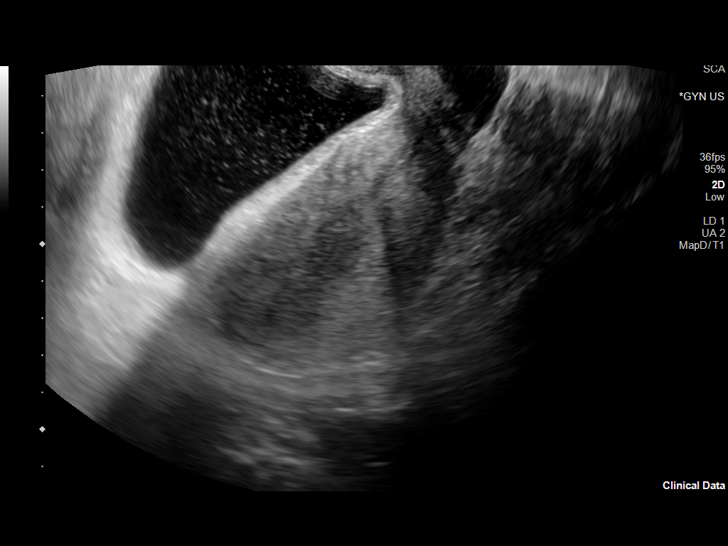

[13 of 28 positions shown; findings below may reference images not displayed]

FINDINGS: Intrauterine gestational sac: An intrauterine gestational sac is
identified on the transabdominal images but not on the transvaginal
images due to attenuation.

Yolk sac:  Not Visualized.

Embryo:  Not Visualized.

Cardiac Activity: Not Visualized.

MSD: 6 mm   5 w   2  d

Subchorionic hemorrhage:  None visualized.

Maternal uterus/adnexae: The right ovary is unremarkable measuring
2.7 cm x 2.6 cm x 1.8 cm. The left ovary is unremarkable measuring
3.1 cm x 2.2 cm x 2.5 cm. There is no free fluid in the pelvis

Other: There is debris in the bladder.
IMPRESSION: 1. Early intrauterine gestational sac identified on the
transabdominal images with an estimated gestational age of 5 weeks 2
days. No definite yolk sac or embryo are identified. Recommend
follow-up quantitative B-HCG levels and follow-up US in 14 days to
assess viability.
2. Debris in the bladder.  Correlate with urinalysis.
